# Patient Record
Sex: Male | Born: 1955 | Race: White | Hispanic: No | Marital: Married | Smoking: Former smoker
Health system: Southern US, Community
[De-identification: ages and names within clinical notes are randomized; demographics above are authoritative.]

## PROBLEM LIST (undated history)

## (undated) DIAGNOSIS — C4491 Basal cell carcinoma of skin, unspecified: Secondary | ICD-10-CM

## (undated) DIAGNOSIS — H9319 Tinnitus, unspecified ear: Secondary | ICD-10-CM

## (undated) DIAGNOSIS — D649 Anemia, unspecified: Secondary | ICD-10-CM

## (undated) DIAGNOSIS — K759 Inflammatory liver disease, unspecified: Secondary | ICD-10-CM

## (undated) DIAGNOSIS — E785 Hyperlipidemia, unspecified: Secondary | ICD-10-CM

## (undated) DIAGNOSIS — M1990 Unspecified osteoarthritis, unspecified site: Secondary | ICD-10-CM

## (undated) HISTORY — DX: Hyperlipidemia, unspecified: E78.5

## (undated) HISTORY — PX: COLONOSCOPY: SHX174

## (undated) HISTORY — PX: TONSILLECTOMY: SUR1361

---

## 2015-09-09 ENCOUNTER — Telehealth: Payer: Self-pay | Admitting: Internal Medicine

## 2015-09-09 NOTE — Telephone Encounter (Signed)
I spoke to the patient, and he said he was a former patient of Dr Laney Pastor.  It has been several years since he has been at [facility], so he is considered a new patient.  He has an appointment for a CPE to establish care with Dr Laney Pastor on 11/04/15.  Someone submitted an insurance referral on 02/25/15 for him to see Dr Delman Cheadle using Dr Laney Pastor as the referring physician, even though there is no establishment of care.  The patient has an appointment on 09/11/15 with Dr Delman Cheadle, and needs another referral.  He is not considered a patient here, and I am unsure on how to proceed.  Would it be ok to submit an insurance referral for him since he is planning to re-establish care here?  The previous dx codes are Z80.8 and L57.0

## 2015-09-09 NOTE — Telephone Encounter (Signed)
Patient request a referral to see a dermatologist. Dr. Jari Pigg. Please call patient at [phone]

## 2015-09-09 NOTE — Telephone Encounter (Signed)
So with his insurance I can't refer without having seen him first//or anyone here Also let him know I'm retiring in May in case he wants to establish with someone else

## 2015-09-10 NOTE — Telephone Encounter (Signed)
Spoke to Pt. And tried to explain to him what Dr. Laney Pastor message but he was very frustrated and he said someone did it for him six months ago and he keeps talking to people from our office and does not understand why it can not be done again. I am unsure of who made the referral six months ago but I tried to help explain it better but he was not happy. We were disconnected before we could really resolve this.

## 2015-11-04 ENCOUNTER — Ambulatory Visit (INDEPENDENT_AMBULATORY_CARE_PROVIDER_SITE_OTHER): Payer: 59 | Admitting: Internal Medicine

## 2015-11-04 ENCOUNTER — Encounter: Payer: Self-pay | Admitting: Internal Medicine

## 2015-11-04 VITALS — BP 123/69 | HR 71 | Temp 98.0°F | Resp 16 | Ht 74.0 in | Wt 219.0 lb

## 2015-11-04 DIAGNOSIS — M25551 Pain in right hip: Secondary | ICD-10-CM

## 2015-11-04 DIAGNOSIS — Z0000 Encounter for general adult medical examination without abnormal findings: Secondary | ICD-10-CM | POA: Diagnosis not present

## 2015-11-04 DIAGNOSIS — L989 Disorder of the skin and subcutaneous tissue, unspecified: Secondary | ICD-10-CM

## 2015-11-04 LAB — CBC WITH DIFFERENTIAL/PLATELET
BASOS ABS: 0.1 10*3/uL (ref 0.0–0.1)
Basophils Relative: 1 % (ref 0–1)
EOS PCT: 0 % (ref 0–5)
Eosinophils Absolute: 0 10*3/uL (ref 0.0–0.7)
HEMATOCRIT: 45 % (ref 39.0–52.0)
HEMOGLOBIN: 14.9 g/dL (ref 13.0–17.0)
LYMPHS ABS: 1.3 10*3/uL (ref 0.7–4.0)
LYMPHS PCT: 20 % (ref 12–46)
MCH: 30.3 pg (ref 26.0–34.0)
MCHC: 33.1 g/dL (ref 30.0–36.0)
MCV: 91.5 fL (ref 78.0–100.0)
MPV: 9 fL (ref 8.6–12.4)
Monocytes Absolute: 0.5 10*3/uL (ref 0.1–1.0)
Monocytes Relative: 8 % (ref 3–12)
NEUTROS ABS: 4.8 10*3/uL (ref 1.7–7.7)
Neutrophils Relative %: 71 % (ref 43–77)
Platelets: 296 10*3/uL (ref 150–400)
RBC: 4.92 MIL/uL (ref 4.22–5.81)
RDW: 12.9 % (ref 11.5–15.5)
WBC: 6.7 10*3/uL (ref 4.0–10.5)

## 2015-11-04 LAB — POC HEMOCCULT BLD/STL (OFFICE/1-CARD/DIAGNOSTIC): Fecal Occult Blood, POC: NEGATIVE

## 2015-11-04 LAB — COMPREHENSIVE METABOLIC PANEL
ALK PHOS: 60 U/L (ref 40–115)
ALT: 29 U/L (ref 9–46)
AST: 26 U/L (ref 10–35)
Albumin: 3.9 g/dL (ref 3.6–5.1)
BILIRUBIN TOTAL: 0.5 mg/dL (ref 0.2–1.2)
BUN: 17 mg/dL (ref 7–25)
CALCIUM: 8.7 mg/dL (ref 8.6–10.3)
CO2: 24 mmol/L (ref 20–31)
Chloride: 103 mmol/L (ref 98–110)
Creat: 0.89 mg/dL (ref 0.70–1.33)
Glucose, Bld: 93 mg/dL (ref 65–99)
POTASSIUM: 4.3 mmol/L (ref 3.5–5.3)
Sodium: 137 mmol/L (ref 135–146)
TOTAL PROTEIN: 6.6 g/dL (ref 6.1–8.1)

## 2015-11-04 LAB — LIPID PANEL
CHOL/HDL RATIO: 6.3 ratio — AB (ref <=5.0)
CHOLESTEROL: 202 mg/dL — AB (ref 125–200)
HDL: 32 mg/dL — AB (ref >=40)
LDL Cholesterol: 150 mg/dL — ABNORMAL HIGH (ref <130)
Triglycerides: 102 mg/dL (ref <150)
VLDL: 20 mg/dL (ref <30)

## 2015-11-05 LAB — PSA: PSA: 1.29 ng/mL (ref <=4.00)

## 2015-11-06 ENCOUNTER — Encounter: Payer: Self-pay | Admitting: Internal Medicine

## 2015-11-06 NOTE — Progress Notes (Signed)
Subjective:    Patient ID: Juan Powers, male    DOB: 1956/01/22, 59 y.o.   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 There is some loss of range of motion.  Past medical history of frequent skin surgeries to prevent actinic keratoses from progressing to basal cell carcinomas-dr Delman Cheadle On no medication Health maintenance issues up-to-date Married with 2 daughters(Ali and Gila) on their own Both in [place] 14 point review of systems performed negative except for very mild tinnitus which has been present for years and does not interfere with his singing    Objective:   Physical Exam  Constitutional: He is oriented to person, place, and time. He appears well-developed and well-nourished. No distress.  HENT:  Head: Normocephalic and atraumatic.  Right Ear: Hearing, tympanic membrane, external ear and ear canal normal.  Left Ear: Hearing, tympanic membrane, external ear and ear canal normal.  Nose: Nose normal.  Mouth/Throat: Uvula is midline, oropharynx is clear and moist and mucous membranes are normal.  Eyes: Conjunctivae, EOM and lids are normal. Pupils are equal, round, and reactive to light. Right eye exhibits no discharge. Left eye exhibits no discharge. No scleral icterus.  Neck: Trachea normal and normal range of motion. Neck supple. Carotid bruit is not present.  Cardiovascular: Normal rate, regular rhythm, normal heart sounds, intact distal pulses and normal pulses.   No murmur heard. Pulmonary/Chest: Effort normal and breath sounds normal. No respiratory distress. He has no wheezes. He has no rhonchi. He has no rales.  Abdominal: Soft. Normal appearance and bowel sounds are normal. He exhibits no abdominal bruit. There is no tenderness.    Genitourinary:  Prostate soft and symmetrical without nodules No rectal masses Hemoccult-negative  Musculoskeletal: Normal range of motion. He exhibits no edema or tenderness.  Right hip has decreased in external rotation compared to the left with pain on crossing his leg but with fairly good range of motion otherwise/ there is no tenderness over the greater trochanter/ the SI joint is nontender  Lymphadenopathy:       Head (right side): No submental, no submandibular, no tonsillar, no preauricular, no posterior auricular and no occipital adenopathy present.       Head (left side): No submental, no submandibular, no tonsillar, no preauricular, no posterior auricular and no occipital adenopathy present.    He has no cervical adenopathy.  Neurological: He is alert and oriented to person, place, and time. He has normal strength and normal reflexes. No cranial nerve deficit or sensory deficit. Coordination and gait normal.  Skin: Skin is warm, dry and intact. No lesion and no rash noted.  Psychiatric: He has a normal mood and affect. His speech is normal and behavior is normal. Judgment and thought content normal.  Nursing note and vitals reviewed. BP 123/69 mmHg  Pulse 71  Temp(Src) 98 F (36.7 C)  Resp 16  Ht '6\' 2"'  (1.88 m)  Wt 219 lb (99.338 kg)  BMI 28.11 kg/m2      Assessment & Plan:  Annual physical exam - Plan: CBC with Differential/Platelet, POC Hemoccult Bld/Stl (1-Cd Office Dx), Comprehensive metabolic panel, Lipid panel, PSA  Hip pain secondary to overuse injury We will refer toDr O'Halloran for physical therapy-imaging only if he doesn't respond  Recurrent skin-sun related  problems Refer back  to Dr. Delman Cheadle   Addendum labs 11-06-15 Results for orders placed or performed in visit on 11/04/15  CBC with Differential/Platelet  Result Value Ref Range   WBC 6.7 4.0 - 10.5 K/uL   RBC 4.92 4.22 - 5.81 MIL/uL   Hemoglobin 14.9 13.0 - 17.0 g/dL   HCT 45.0 39.0 - 52.0 %   MCV  91.5 78.0 - 100.0 fL   MCH 30.3 26.0 - 34.0 pg   MCHC 33.1 30.0 - 36.0 g/dL   RDW 12.9 11.5 - 15.5 %   Platelets 296 150 - 400 K/uL   MPV 9.0 8.6 - 12.4 fL   Neutrophils Relative % 71 43 - 77 %   Neutro Abs 4.8 1.7 - 7.7 K/uL   Lymphocytes Relative 20 12 - 46 %   Lymphs Abs 1.3 0.7 - 4.0 K/uL   Monocytes Relative 8 3 - 12 %   Monocytes Absolute 0.5 0.1 - 1.0 K/uL   Eosinophils Relative 0 0 - 5 %   Eosinophils Absolute 0.0 0.0 - 0.7 K/uL   Basophils Relative 1 0 - 1 %   Basophils Absolute 0.1 0.0 - 0.1 K/uL   Smear Review Criteria for review not met   Comprehensive metabolic panel  Result Value Ref Range   Sodium 137 135 - 146 mmol/L   Potassium 4.3 3.5 - 5.3 mmol/L   Chloride 103 98 - 110 mmol/L   CO2 24 20 - 31 mmol/L   Glucose, Bld 93 65 - 99 mg/dL   BUN 17 7 - 25 mg/dL   Creat 0.89 0.70 - 1.33 mg/dL   Total Bilirubin 0.5 0.2 - 1.2 mg/dL   Alkaline Phosphatase 60 40 - 115 U/L   AST 26 10 - 35 U/L   ALT 29 9 - 46 U/L   Total Protein 6.6 6.1 - 8.1 g/dL   Albumin 3.9 3.6 - 5.1 g/dL   Calcium 8.7 8.6 - 10.3 mg/dL  Lipid panel  Result Value Ref Range   Cholesterol 202 (H) 125 - 200 mg/dL   Triglycerides 102 <150 mg/dL   HDL 32 (L) >=40 mg/dL   Total CHOL/HDL Ratio 6.3 (H) <=5.0 Ratio   VLDL 20 <30 mg/dL   LDL Cholesterol 150 (H) <130 mg/dL  PSA  Result Value Ref Range   PSA 1.29 <=4.00 ng/mL  POC Hemoccult Bld/Stl (1-Cd Office Dx)  Result Value Ref Range   Card #1 Date 11/04/2015    Fecal Occult Blood, POC Negative Negative   We will approach his elevated LDL with diet and exercise

## 2016-08-26 ENCOUNTER — Encounter (HOSPITAL_COMMUNITY): Payer: Self-pay | Admitting: *Deleted

## 2016-08-26 ENCOUNTER — Ambulatory Visit (HOSPITAL_COMMUNITY)
Admission: EM | Admit: 2016-08-26 | Discharge: 2016-08-26 | Disposition: A | Discharge status: Home / Self Care | Payer: BLUE CROSS/BLUE SHIELD | Attending: Internal Medicine | Admitting: Internal Medicine

## 2016-08-26 DIAGNOSIS — J069 Acute upper respiratory infection, unspecified: Secondary | ICD-10-CM

## 2016-08-26 DIAGNOSIS — R05 Cough: Secondary | ICD-10-CM | POA: Diagnosis not present

## 2016-08-26 DIAGNOSIS — R059 Cough, unspecified: Secondary | ICD-10-CM

## 2016-08-26 MED ORDER — AMOXICILLIN-POT CLAVULANATE 875-125 MG PO TABS: 1.0000 | ORAL_TABLET | Freq: Two times a day (BID) | ORAL | 0 refills | Status: DC

## 2016-08-26 MED ORDER — HYDROCODONE-HOMATROPINE 5-1.5 MG/5ML PO SYRP: 5.0000 mL | ORAL_SOLUTION | Freq: Four times a day (QID) | ORAL | 0 refills | Status: DC | PRN

## 2016-08-26 NOTE — Discharge Instructions (Signed)
You most likely have a bacterial upper respiratory infection or sinusitis. Treat with antibiotic. May use Motrin 600mg  every 8 hours combined with Mucinex and Zyrtec to make a great "cold pill". May use the cough syrup every 6 hours as needed, but will  make you sleepy. Feel better stay hydrated.

## 2016-08-26 NOTE — ED Triage Notes (Signed)
Pt  Reports       Symptoms     Of  Cough    / congested    Sinus         With  Drainage      And fever    With  Symptoms  For  A  Few  Days

## 2016-08-26 NOTE — ED Provider Notes (Signed)
CSN: [id number]     Arrival date & time 08/26/16  1715 History   First MD Initiated Contact with Patient 08/26/16 1828     Chief Complaint  Patient presents with  . URI   (Consider location/radiation/quality/duration/timing/severity/associated sxs/prior Treatment) Juan Powers presents with a 1 week history of nasal congestion, sore throat, now dry cough, malaise and fever.       No past medical history on file. Past Surgical History:  Procedure Laterality Date  . TONSILLECTOMY     Family History  Problem Relation Age of Onset  . Cancer Father   . Heart disease Father   . Cancer Sister    Social History  Substance Use Topics  . Smoking status: Former Research scientist (life sciences)  . Smokeless tobacco: Not on file  . Alcohol use 0.0 oz/week    Review of Systems  Constitutional: Positive for fatigue and fever.  HENT: Positive for congestion and sinus pressure.   Respiratory: Positive for cough. Negative for shortness of breath and wheezing.   Musculoskeletal: Negative for myalgias.  Allergic/Immunologic: Negative for environmental allergies.    Allergies  Other  Home Medications   Prior to Admission medications   Medication Sig Start Date End Date Taking? Authorizing Provider  amoxicillin-clavulanate (AUGMENTIN) 875-125 MG tablet Take 1 tablet by mouth 2 (two) times daily. 08/26/16   Bjorn Pippin, PA-C  HYDROcodone-homatropine (HYCODAN) 5-1.5 MG/5ML syrup Take 5 mLs by mouth every 6 (six) hours as needed for cough. 08/26/16   Bjorn Pippin, PA-C   Meds Ordered and Administered this Visit  Medications - No data to display  There were no vitals taken for this visit. No data found.   Physical Exam  Constitutional: He is oriented to person, place, and time. He appears well-developed and well-nourished. No distress.  HENT:  Head: Normocephalic and atraumatic.  Right Ear: External ear normal.  Left Ear: External ear normal.  Erythematous oropharynx, no exudate, erythematous nasal  turbinates with yellow exudate  Neck: Normal range of motion. Neck supple.  Cardiovascular: Normal rate and regular rhythm.   Pulmonary/Chest: Effort normal and breath sounds normal. He has no wheezes.  Lymphadenopathy:    He has no cervical adenopathy.  Neurological: He is alert and oriented to person, place, and time.  Skin: Skin is warm and dry. He is not diaphoretic.  Nursing note and vitals reviewed.   Urgent Care Course   Clinical Course    Procedures (including critical care time)  Labs Review Labs Reviewed - No data to display  Imaging Review No results found.   Visual Acuity Review  Right Eye Distance:   Left Eye Distance:   Bilateral Distance:    Right Eye Near:   Left Eye Near:    Bilateral Near:         MDM   1. URI (upper respiratory infection)   2. Cough    Presents with fevers, cough and congestion x 7 days. Given duration, fevers and malaise will cover with antibiotics. Treat symptomatically with Hycodan, Mucinex and Zyrtec. Motrin as directed for fevers.     Bjorn Pippin, PA-C 08/26/16 [phone]

## 2018-05-31 ENCOUNTER — Ambulatory Visit (INDEPENDENT_AMBULATORY_CARE_PROVIDER_SITE_OTHER): Payer: Self-pay | Admitting: Orthopaedic Surgery

## 2018-06-11 ENCOUNTER — Ambulatory Visit (INDEPENDENT_AMBULATORY_CARE_PROVIDER_SITE_OTHER): Payer: Managed Care, Other (non HMO)

## 2018-06-11 ENCOUNTER — Encounter (INDEPENDENT_AMBULATORY_CARE_PROVIDER_SITE_OTHER): Payer: Self-pay | Admitting: Orthopaedic Surgery

## 2018-06-11 ENCOUNTER — Ambulatory Visit (INDEPENDENT_AMBULATORY_CARE_PROVIDER_SITE_OTHER): Payer: Managed Care, Other (non HMO) | Admitting: Orthopaedic Surgery

## 2018-06-11 DIAGNOSIS — G8929 Other chronic pain: Secondary | ICD-10-CM | POA: Diagnosis not present

## 2018-06-11 DIAGNOSIS — M1611 Unilateral primary osteoarthritis, right hip: Secondary | ICD-10-CM

## 2018-06-11 DIAGNOSIS — M545 Low back pain, unspecified: Secondary | ICD-10-CM

## 2018-06-11 DIAGNOSIS — M25551 Pain in right hip: Secondary | ICD-10-CM

## 2018-06-11 NOTE — Progress Notes (Signed)
Office Visit Note   Patient: Juan Powers           Date of Birth: [birth year]           MRN: [id number] Visit Date: 06/11/2018              Requested by: No referring provider defined for this encounter. PCP: Patient, No Pcp Per   Assessment & Plan: Visit Diagnoses:  1. Chronic right-sided low back pain without sciatica   2. Pain in right hip   3. Unilateral primary osteoarthritis, right hip     Plan: His right hip is really was bothering him the most.  At this point his pain is become daily and is almost detrimentally affect is active daily living, quality of life, his mobility.  Given his x-ray findings and severity of arthritis of his hip we are recommending hip replacement surgery.  At this point a steroid injection would only temporize things minimally given the severity of arthritis.  We went over his x-rays in detail and I talked about hip replacement surgery.  I gave him a handout about this as well.  We talked about his intraoperative and postoperative course as well as the risk and benefits of the surgery.  This is something may consider in the near future.  I gave him our surgery schedulers card if he has questions about insurance issues as relates to this type of surgery.  I did give him a printout of his x-rays as well and told him happy to talk to him about this further but I do feel that a hip replacement surgery to help him significantly improve his mobility as well as quality of life.  All question concerns were answered and addressed.  Follow-Up Instructions: Return if symptoms worsen or fail to improve.   Orders:  Orders Placed This Encounter  Procedures  . XR Lumbar Spine 2-3 Views  . XR HIP UNILAT W OR W/O PELVIS 1V RIGHT   No orders of the defined types were placed in this encounter.     Procedures: No procedures performed   Clinical Data: No additional findings.   Subjective: Chief Complaint  Patient presents with  . Right Hip - Pain  Patient  comes in today with chief complaint of right hip pain as well as low back pain the right hip is been worsening for about 2 to 3 years now it hurts in the groin.  Is very stiff hip and hurts with rotation and aerobic activities with mobility.  It hurts on a daily basis.  He started to walk with a limp because of this.  He is also had chronic low back pain for a long period of time with a history of renal injury back in the 100s.  He did seek chiropractic care for the back before.  He says his right hip is the biggest issue for him.  He denies any left hip pain at all.  He denies any numbness and tingling going down his feet legs or any weakness.  When I pressed him on this he says he does get occasional numbness in his feet.  Is not diabetic and not a smoker.  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  He denies any specific injury to the hip but is been slowly getting worse with time.  HPI  Review of Systems He currently denies any headache, chest pain, shortness of breath, fever, chills, nausea,  vomiting.  Objective: Vital Signs: There were no vitals taken for this visit.  Physical Exam He is alert and oriented x3 and in no acute distress Ortho Exam Examination of his left hip is normal examination of his right hip shows significant stiffness with decreased internal extra rotation as well as pain on internal and external rotation.  He has good flexion-extension of lumbar spine with pain more on extension of the lumbar spine with some limitations in extension as well.  He has great strength in his bilateral lower extremity today with normal reflexes and normal sensation. Specialty Comments:  No specialty comments available.  Imaging: Xr Hip Unilat W Or W/o Pelvis 1v Right  Result Date: 06/11/2018 An AP pelvis and lateral of the right hip show severe end-stage arthritis of the right hip.  There is complete loss of superior lateral joint space.  There are large  para-articular osteophytes coming off the acetabulum and the femoral head with femoral head flattening.  There are sclerotic changes in the femoral head and acetabulum as well.  The left hip on the AP view does show moderate to severe osteoarthritis as well.  Xr Lumbar Spine 2-3 Views  Result Date: 06/11/2018 2 views of the lumbar spine shows severe degenerative changes from L3-L4 and L5-S1.  This involves the posterior facets and the disc spaces themselves as well as the foramina.    PMFS History: There are no active problems to display for this patient.  History reviewed. No pertinent past medical history.  Family History  Problem Relation Age of Onset  . Cancer Father   . Heart disease Father   . Cancer Sister     Past Surgical History:  Procedure Laterality Date  . TONSILLECTOMY     Social History   Occupational History  . Not on file  Tobacco Use  . Smoking status: Former Smoker  Substance and Sexual Activity  . Alcohol use: Yes    Alcohol/week: 0.0 oz  . Drug use: No  . Sexual activity: Not on file

## 2018-06-11 NOTE — Progress Notes (Deleted)
                                                                                                                                                                                                    Office Visit Note   Patient: Juan Powers           Date of Birth: 1956-08-30           MRN: [id number] Visit Date: 06/11/2018              Requested by: No referring provider defined for this encounter. PCP: Patient, No Pcp Per   Assessment & Plan: Visit Diagnoses:  1. Chronic right-sided low back pain without sciatica   2. Pain in right hip     Plan: ***  Follow-Up Instructions: No follow-ups on file.   Orders:  Orders Placed This Encounter  Procedures  . XR Lumbar Spine 2-3 Views  . XR HIP UNILAT W OR W/O PELVIS 1V RIGHT   No orders of the defined types were placed in this encounter.     Procedures: No procedures performed   Clinical Data: No additional findings.   Subjective: Chief Complaint  Patient presents with  . Right Hip - Pain    HPI  Review of Systems   Objective: Vital Signs: There were no vitals taken for this visit.  Physical Exam  Ortho Exam  Specialty Comments:  No specialty comments available.  Imaging: No results found.   PMFS History: There are no active problems to display for this patient.  History reviewed. No pertinent past medical history.  Family History  Problem Relation Age of Onset  . Cancer Father   . Heart disease Father   . Cancer Sister     Past Surgical History:  Procedure Laterality Date  . TONSILLECTOMY     Social History   Occupational History  . Not on file  Tobacco Use  . Smoking status: Former Smoker  Substance and Sexual Activity  . Alcohol use: Yes    Alcohol/week: 0.0 oz  . Drug use: No  . Sexual activity:  Not on file

## 2018-10-03 ENCOUNTER — Telehealth (INDEPENDENT_AMBULATORY_CARE_PROVIDER_SITE_OTHER): Payer: Self-pay

## 2018-10-03 NOTE — Telephone Encounter (Signed)
Wife, Juan Powers called and discussed scheduling [facility] for 11-23-18.  She then called back and left voice mail that they will postpone until Summer 2020.

## 2018-11-12 ENCOUNTER — Other Ambulatory Visit (INDEPENDENT_AMBULATORY_CARE_PROVIDER_SITE_OTHER): Payer: Self-pay | Admitting: Physician Assistant

## 2018-11-12 ENCOUNTER — Other Ambulatory Visit (INDEPENDENT_AMBULATORY_CARE_PROVIDER_SITE_OTHER): Payer: Self-pay

## 2018-11-14 NOTE — Patient Instructions (Addendum)
Juan Powers  11/14/2018   Your procedure is scheduled on: 11-23-18   Report to [facility] Main  Entrance    Report to admitting at 6:00AM    Call this number if you have problems the morning of surgery [phone]     Remember: Do not eat food or drink liquids :After Midnight. BRUSH YOUR TEETH MORNING OF SURGERY AND RINSE YOUR MOUTH OUT, NO CHEWING GUM CANDY OR MINTS.     Take these medicines the morning of surgery with A SIP OF WATER: zyrtec                                You may not have any metal on your body including hair pins and              piercings  Do not wear jewelry, make-up, lotions, powders or perfumes, deodorant                       Men may shave face and neck.   Do not bring valuables to the hospital. [place].  Contacts, dentures or bridgework may not be worn into surgery.  Leave suitcase in the car. After surgery it may be brought to your room.                 Please read over the following fact sheets you were given: _____________________________________________________________________    [facility] - Preparing for Surgery Before surgery, you can play an important role.  Because skin is not sterile, your skin needs to be as free of germs as possible.  You can reduce the number of germs on your skin by washing with CHG (chlorahexidine gluconate) soap before surgery.  CHG is an antiseptic cleaner which kills germs and bonds with the skin to continue killing germs even after washing. Please DO NOT use if you have an allergy to CHG or antibacterial soaps.  If your skin becomes reddened/irritated stop using the CHG and inform your nurse when you arrive at Short Stay. Do not shave (including legs and underarms) for at least 48 hours prior to the first CHG shower.  You may shave your face/neck. Please follow these instructions carefully:  1.  Shower with CHG Soap the night before  surgery and the  morning of Surgery.  2.  If you choose to wash your hair, wash your hair first as usual with your  normal  shampoo.  3.  After you shampoo, rinse your hair and body thoroughly to remove the  shampoo.                           4.  Use CHG as you would any other liquid soap.  You can apply chg directly  to the skin and wash                       Gently with a scrungie or clean washcloth.  5.  Apply the CHG Soap to your body ONLY FROM THE NECK DOWN.   Do not use on face/ open  Wound or open sores. Avoid contact with eyes, ears mouth and genitals (private parts).                       Wash face,  Genitals (private parts) with your normal soap.             6.  Wash thoroughly, paying special attention to the area where your surgery  will be performed.  7.  Thoroughly rinse your body with warm water from the neck down.  8.  DO NOT shower/wash with your normal soap after using and rinsing off  the CHG Soap.                9.  Pat yourself dry with a clean towel.            10.  Wear clean pajamas.            11.  Place clean sheets on your bed the night of your first shower and do not  sleep with pets. Day of Surgery : Do not apply any lotions/deodorants the morning of surgery.  Please wear clean clothes to the hospital/surgery center.  FAILURE TO FOLLOW THESE INSTRUCTIONS MAY RESULT IN THE CANCELLATION OF YOUR SURGERY PATIENT SIGNATURE_________________________________  NURSE SIGNATURE__________________________________  ________________________________________________________________________              Juan Powers  An incentive spirometer is a tool that can help keep your lungs clear and active. This tool measures how well you are filling your lungs with each breath. Taking long deep breaths may help reverse or decrease the chance of developing breathing (pulmonary) problems (especially infection) following:  A long period of time when you  are unable to move or be active. BEFORE THE PROCEDURE   If the spirometer includes an indicator to show your best effort, your nurse or respiratory therapist will set it to a desired goal.  If possible, sit up straight or lean slightly forward. Try not to slouch.  Hold the incentive spirometer in an upright position. INSTRUCTIONS FOR USE  1. Sit on the edge of your bed if possible, or sit up as far as you can in bed or on a chair. 2. Hold the incentive spirometer in an upright position. 3. Breathe out normally. 4. Place the mouthpiece in your mouth and seal your lips tightly around it. 5. Breathe in slowly and as deeply as possible, raising the piston or the ball toward the top of the column. 6. Hold your breath for 3-5 seconds or for as long as possible. Allow the piston or ball to fall to the bottom of the column. 7. Remove the mouthpiece from your mouth and breathe out normally. 8. Rest for a few seconds and repeat Steps 1 through 7 at least 10 times every 1-2 hours when you are awake. Take your time and take a few normal breaths between deep breaths. 9. The spirometer may include an indicator to show your best effort. Use the indicator as a goal to work toward during each repetition. 10. After each set of 10 deep breaths, practice coughing to be sure your lungs are clear. If you have an incision (the cut made at the time of surgery), support your incision when coughing by placing a pillow or rolled up towels firmly against it. Once you are able to get out of bed, walk around indoors and cough well. You may stop using the incentive spirometer when instructed by your caregiver.  RISKS  AND COMPLICATIONS  Take your time so you do not get dizzy or light-headed.  If you are in pain, you may need to take or ask for pain medication before doing incentive spirometry. It is harder to take a deep breath if you are having pain. AFTER USE  Rest and breathe slowly and easily.  It can be helpful to  keep track of a log of your progress. Your caregiver can provide you with a simple table to help with this. If you are using the spirometer at home, follow these instructions: [place] IF:   You are having difficultly using the spirometer.  You have trouble using the spirometer as often as instructed.  Your pain medication is not giving enough relief while using the spirometer.  You develop fever of 100.5 F (38.1 C) or higher. SEEK IMMEDIATE MEDICAL CARE IF:   You cough up bloody sputum that had not been present before.  You develop fever of 102 F (38.9 C) or greater.  You develop worsening pain at or near the incision site. MAKE SURE YOU:   Understand these instructions.  Will watch your condition.  Will get help right away if you are not doing well or get worse. Document Released: 04/03/2007 Document Revised: 02/13/2012 Document Reviewed: 06/04/2007 [facility] Patient Information 2014 [place], [place].   ________________________________________________________________________

## 2018-11-19 ENCOUNTER — Other Ambulatory Visit: Payer: Self-pay

## 2018-11-19 ENCOUNTER — Encounter (HOSPITAL_COMMUNITY)
Admission: RE | Admit: 2018-11-19 | Discharge: 2018-11-19 | Disposition: A | Discharge status: Home / Self Care | Payer: Managed Care, Other (non HMO) | Source: Ambulatory Visit | Attending: Orthopaedic Surgery | Admitting: Orthopaedic Surgery

## 2018-11-19 ENCOUNTER — Encounter (HOSPITAL_COMMUNITY): Payer: Self-pay

## 2018-11-19 DIAGNOSIS — M1611 Unilateral primary osteoarthritis, right hip: Secondary | ICD-10-CM | POA: Diagnosis not present

## 2018-11-19 DIAGNOSIS — Z01818 Encounter for other preprocedural examination: Secondary | ICD-10-CM | POA: Diagnosis not present

## 2018-11-19 HISTORY — DX: Inflammatory liver disease, unspecified: K75.9

## 2018-11-19 HISTORY — DX: Unspecified osteoarthritis, unspecified site: M19.90

## 2018-11-19 HISTORY — DX: Tinnitus, unspecified ear: H93.19

## 2018-11-19 HISTORY — DX: Basal cell carcinoma of skin, unspecified: C44.91

## 2018-11-19 HISTORY — DX: Anemia, unspecified: D64.9

## 2018-11-19 LAB — CBC
HCT: 47.8 % (ref 39.0–52.0)
Hemoglobin: 15.5 g/dL (ref 13.0–17.0)
MCH: 30 pg (ref 26.0–34.0)
MCHC: 32.4 g/dL (ref 30.0–36.0)
MCV: 92.5 fL (ref 80.0–100.0)
Platelets: 302 10*3/uL (ref 150–400)
RBC: 5.17 MIL/uL (ref 4.22–5.81)
RDW: 11.9 % (ref 11.5–15.5)
WBC: 6 10*3/uL (ref 4.0–10.5)
nRBC: 0 % (ref 0.0–0.2)

## 2018-11-19 LAB — SURGICAL PCR SCREEN
MRSA, PCR: NEGATIVE
STAPHYLOCOCCUS AUREUS: NEGATIVE

## 2018-11-23 ENCOUNTER — Inpatient Hospital Stay (HOSPITAL_COMMUNITY): Payer: Managed Care, Other (non HMO) | Admitting: Anesthesiology

## 2018-11-23 ENCOUNTER — Inpatient Hospital Stay (HOSPITAL_COMMUNITY): Payer: Managed Care, Other (non HMO)

## 2018-11-23 ENCOUNTER — Inpatient Hospital Stay (HOSPITAL_COMMUNITY)
Admission: RE | Admit: 2018-11-23 | Discharge: 2018-11-24 | DRG: 470 | Disposition: A | Discharge status: Home / Self Care | Payer: Managed Care, Other (non HMO) | Attending: Orthopaedic Surgery | Admitting: Orthopaedic Surgery

## 2018-11-23 ENCOUNTER — Encounter (HOSPITAL_COMMUNITY)
Admission: RE | Disposition: A | Discharge status: Home / Self Care | Payer: Self-pay | Source: Home / Self Care | Attending: Orthopaedic Surgery

## 2018-11-23 ENCOUNTER — Other Ambulatory Visit: Payer: Self-pay

## 2018-11-23 ENCOUNTER — Encounter (HOSPITAL_COMMUNITY): Payer: Self-pay | Admitting: Anesthesiology

## 2018-11-23 DIAGNOSIS — M25559 Pain in unspecified hip: Secondary | ICD-10-CM

## 2018-11-23 DIAGNOSIS — Z87891 Personal history of nicotine dependence: Secondary | ICD-10-CM | POA: Diagnosis not present

## 2018-11-23 DIAGNOSIS — Z8249 Family history of ischemic heart disease and other diseases of the circulatory system: Secondary | ICD-10-CM | POA: Diagnosis not present

## 2018-11-23 DIAGNOSIS — M160 Bilateral primary osteoarthritis of hip: Principal | ICD-10-CM | POA: Diagnosis present

## 2018-11-23 DIAGNOSIS — Z85828 Personal history of other malignant neoplasm of skin: Secondary | ICD-10-CM

## 2018-11-23 DIAGNOSIS — Z8619 Personal history of other infectious and parasitic diseases: Secondary | ICD-10-CM | POA: Diagnosis not present

## 2018-11-23 DIAGNOSIS — Z96641 Presence of right artificial hip joint: Secondary | ICD-10-CM

## 2018-11-23 DIAGNOSIS — Z79899 Other long term (current) drug therapy: Secondary | ICD-10-CM

## 2018-11-23 DIAGNOSIS — M1611 Unilateral primary osteoarthritis, right hip: Secondary | ICD-10-CM | POA: Diagnosis not present

## 2018-11-23 DIAGNOSIS — Z888 Allergy status to other drugs, medicaments and biological substances status: Secondary | ICD-10-CM | POA: Diagnosis not present

## 2018-11-23 HISTORY — PX: TOTAL HIP ARTHROPLASTY: SHX124

## 2018-11-23 SURGERY — ARTHROPLASTY, HIP, TOTAL, ANTERIOR APPROACH
Anesthesia: Spinal | Site: Hip | Laterality: Right

## 2018-11-23 MED ORDER — TAMSULOSIN HCL 0.4 MG PO CAPS
0.4000 mg | ORAL_CAPSULE | Freq: Every day | ORAL | Status: DC
  Administered 2018-11-23: 0.4 mg via ORAL
  Filled 2018-11-23: qty 1

## 2018-11-23 MED ORDER — TRANEXAMIC ACID-NACL 1000-0.7 MG/100ML-% IV SOLN
1000.0000 mg | INTRAVENOUS | Status: AC
  Administered 2018-11-23: 1000 mg via INTRAVENOUS
  Filled 2018-11-23: qty 100

## 2018-11-23 MED ORDER — METHOCARBAMOL 500 MG IVPB - SIMPLE MED
500.0000 mg | Freq: Four times a day (QID) | INTRAVENOUS | Status: DC | PRN
  Administered 2018-11-23: 500 mg via INTRAVENOUS
  Filled 2018-11-23: qty 50

## 2018-11-23 MED ORDER — MEPERIDINE HCL 50 MG/ML IJ SOLN: 6.2500 mg | INTRAMUSCULAR | Status: DC | PRN

## 2018-11-23 MED ORDER — ACETAMINOPHEN 325 MG PO TABS
325.0000 mg | ORAL_TABLET | Freq: Four times a day (QID) | ORAL | Status: DC | PRN
  Administered 2018-11-23: 650 mg via ORAL
  Filled 2018-11-23: qty 2

## 2018-11-23 MED ORDER — LACTATED RINGERS IV SOLN
INTRAVENOUS | Status: DC
  Administered 2018-11-23 (×2): via INTRAVENOUS

## 2018-11-23 MED ORDER — ONDANSETRON HCL 4 MG/2ML IJ SOLN
INTRAMUSCULAR | Status: DC | PRN
  Administered 2018-11-23: 4 mg via INTRAVENOUS

## 2018-11-23 MED ORDER — DEXAMETHASONE SODIUM PHOSPHATE 10 MG/ML IJ SOLN
INTRAMUSCULAR | Status: AC
  Filled 2018-11-23: qty 1

## 2018-11-23 MED ORDER — SODIUM CHLORIDE 0.9 % IR SOLN
Status: DC | PRN
  Administered 2018-11-23: 1000 mL

## 2018-11-23 MED ORDER — 0.9 % SODIUM CHLORIDE (POUR BTL) OPTIME
TOPICAL | Status: DC | PRN
  Administered 2018-11-23: 1000 mL

## 2018-11-23 MED ORDER — MIDAZOLAM HCL 5 MG/5ML IJ SOLN
INTRAMUSCULAR | Status: DC | PRN
  Administered 2018-11-23: 2 mg via INTRAVENOUS

## 2018-11-23 MED ORDER — OXYCODONE HCL 5 MG PO TABS
10.0000 mg | ORAL_TABLET | ORAL | Status: DC | PRN
  Administered 2018-11-23: 15 mg via ORAL
  Filled 2018-11-23: qty 2
  Filled 2018-11-23: qty 3

## 2018-11-23 MED ORDER — METHOCARBAMOL 500 MG IVPB - SIMPLE MED
INTRAVENOUS | Status: AC
  Filled 2018-11-23: qty 50

## 2018-11-23 MED ORDER — DOCUSATE SODIUM 100 MG PO CAPS
100.0000 mg | ORAL_CAPSULE | Freq: Two times a day (BID) | ORAL | Status: DC
  Administered 2018-11-23 – 2018-11-24 (×2): 100 mg via ORAL
  Filled 2018-11-23 (×2): qty 1

## 2018-11-23 MED ORDER — KETOROLAC TROMETHAMINE 15 MG/ML IJ SOLN
15.0000 mg | Freq: Four times a day (QID) | INTRAMUSCULAR | Status: DC
  Administered 2018-11-23 – 2018-11-24 (×4): 15 mg via INTRAVENOUS
  Filled 2018-11-23 (×4): qty 1

## 2018-11-23 MED ORDER — FENTANYL CITRATE (PF) 100 MCG/2ML IJ SOLN: 25.0000 ug | INTRAMUSCULAR | Status: DC | PRN

## 2018-11-23 MED ORDER — CHLORHEXIDINE GLUCONATE 4 % EX LIQD: 60.0000 mL | Freq: Once | CUTANEOUS | Status: DC

## 2018-11-23 MED ORDER — MENTHOL 3 MG MT LOZG: 1.0000 | LOZENGE | OROMUCOSAL | Status: DC | PRN

## 2018-11-23 MED ORDER — KETOROLAC TROMETHAMINE 15 MG/ML IJ SOLN: 15.0000 mg | Freq: Once | INTRAMUSCULAR | Status: DC

## 2018-11-23 MED ORDER — SODIUM CHLORIDE 0.9 % IV SOLN
INTRAVENOUS | Status: DC
  Administered 2018-11-23: 12:00:00 via INTRAVENOUS

## 2018-11-23 MED ORDER — EPHEDRINE SULFATE-NACL 50-0.9 MG/10ML-% IV SOSY
PREFILLED_SYRINGE | INTRAVENOUS | Status: DC | PRN
  Administered 2018-11-23 (×2): 5 mg via INTRAVENOUS
  Administered 2018-11-23: 10 mg via INTRAVENOUS

## 2018-11-23 MED ORDER — ONDANSETRON HCL 4 MG/2ML IJ SOLN: 4.0000 mg | Freq: Four times a day (QID) | INTRAMUSCULAR | Status: DC | PRN

## 2018-11-23 MED ORDER — PROPOFOL 10 MG/ML IV BOLUS
INTRAVENOUS | Status: DC | PRN
  Administered 2018-11-23 (×2): 20 mg via INTRAVENOUS

## 2018-11-23 MED ORDER — PHENOL 1.4 % MT LIQD: 1.0000 | OROMUCOSAL | Status: DC | PRN

## 2018-11-23 MED ORDER — ZOLPIDEM TARTRATE 5 MG PO TABS: 5.0000 mg | ORAL_TABLET | Freq: Every evening | ORAL | Status: DC | PRN

## 2018-11-23 MED ORDER — EPHEDRINE 5 MG/ML INJ
INTRAVENOUS | Status: AC
  Filled 2018-11-23: qty 10

## 2018-11-23 MED ORDER — FENTANYL CITRATE (PF) 100 MCG/2ML IJ SOLN
INTRAMUSCULAR | Status: AC
  Filled 2018-11-23: qty 2

## 2018-11-23 MED ORDER — METOCLOPRAMIDE HCL 5 MG PO TABS: 5.0000 mg | ORAL_TABLET | Freq: Three times a day (TID) | ORAL | Status: DC | PRN

## 2018-11-23 MED ORDER — DEXAMETHASONE SODIUM PHOSPHATE 10 MG/ML IJ SOLN
INTRAMUSCULAR | Status: DC | PRN
  Administered 2018-11-23: 10 mg via INTRAVENOUS

## 2018-11-23 MED ORDER — ASPIRIN 81 MG PO CHEW
81.0000 mg | CHEWABLE_TABLET | Freq: Two times a day (BID) | ORAL | Status: DC
  Administered 2018-11-23 – 2018-11-24 (×2): 81 mg via ORAL
  Filled 2018-11-23 (×2): qty 1

## 2018-11-23 MED ORDER — DIPHENHYDRAMINE HCL 12.5 MG/5ML PO ELIX: 12.5000 mg | ORAL_SOLUTION | ORAL | Status: DC | PRN

## 2018-11-23 MED ORDER — HYDROMORPHONE HCL 1 MG/ML IJ SOLN: 0.5000 mg | INTRAMUSCULAR | Status: DC | PRN

## 2018-11-23 MED ORDER — ONDANSETRON HCL 4 MG PO TABS: 4.0000 mg | ORAL_TABLET | Freq: Four times a day (QID) | ORAL | Status: DC | PRN

## 2018-11-23 MED ORDER — METOCLOPRAMIDE HCL 5 MG/ML IJ SOLN: 5.0000 mg | Freq: Three times a day (TID) | INTRAMUSCULAR | Status: DC | PRN

## 2018-11-23 MED ORDER — CEFAZOLIN SODIUM-DEXTROSE 1-4 GM/50ML-% IV SOLN
1.0000 g | Freq: Four times a day (QID) | INTRAVENOUS | Status: AC
  Administered 2018-11-23 (×2): 1 g via INTRAVENOUS
  Filled 2018-11-23 (×2): qty 50

## 2018-11-23 MED ORDER — FENTANYL CITRATE (PF) 100 MCG/2ML IJ SOLN
INTRAMUSCULAR | Status: DC | PRN
  Administered 2018-11-23: 100 ug via INTRAVENOUS

## 2018-11-23 MED ORDER — PHENYLEPHRINE 40 MCG/ML (10ML) SYRINGE FOR IV PUSH (FOR BLOOD PRESSURE SUPPORT)
PREFILLED_SYRINGE | INTRAVENOUS | Status: DC | PRN
  Administered 2018-11-23 (×3): 80 ug via INTRAVENOUS

## 2018-11-23 MED ORDER — PANTOPRAZOLE SODIUM 40 MG PO TBEC
40.0000 mg | DELAYED_RELEASE_TABLET | Freq: Every day | ORAL | Status: DC
  Administered 2018-11-23 – 2018-11-24 (×2): 40 mg via ORAL
  Filled 2018-11-23 (×2): qty 1

## 2018-11-23 MED ORDER — MIDAZOLAM HCL 2 MG/2ML IJ SOLN
INTRAMUSCULAR | Status: AC
  Filled 2018-11-23: qty 2

## 2018-11-23 MED ORDER — STERILE WATER FOR IRRIGATION IR SOLN
Status: DC | PRN
  Administered 2018-11-23: 2000 mL

## 2018-11-23 MED ORDER — PROPOFOL 500 MG/50ML IV EMUL
INTRAVENOUS | Status: DC | PRN
  Administered 2018-11-23: 100 ug/kg/min via INTRAVENOUS

## 2018-11-23 MED ORDER — PROPOFOL 10 MG/ML IV BOLUS
INTRAVENOUS | Status: AC
  Filled 2018-11-23: qty 60

## 2018-11-23 MED ORDER — POLYETHYLENE GLYCOL 3350 17 G PO PACK: 17.0000 g | PACK | Freq: Every day | ORAL | Status: DC | PRN

## 2018-11-23 MED ORDER — PROPOFOL 10 MG/ML IV BOLUS
INTRAVENOUS | Status: AC
  Filled 2018-11-23: qty 20

## 2018-11-23 MED ORDER — METHOCARBAMOL 500 MG PO TABS
500.0000 mg | ORAL_TABLET | Freq: Four times a day (QID) | ORAL | Status: DC | PRN
  Filled 2018-11-23: qty 1

## 2018-11-23 MED ORDER — BUPIVACAINE IN DEXTROSE 0.75-8.25 % IT SOLN
INTRATHECAL | Status: DC | PRN
  Administered 2018-11-23: 1.6 mL via INTRATHECAL

## 2018-11-23 MED ORDER — ONDANSETRON HCL 4 MG/2ML IJ SOLN
INTRAMUSCULAR | Status: AC
  Filled 2018-11-23: qty 2

## 2018-11-23 MED ORDER — PROMETHAZINE HCL 25 MG/ML IJ SOLN: 6.2500 mg | INTRAMUSCULAR | Status: DC | PRN

## 2018-11-23 MED ORDER — OXYCODONE HCL 5 MG PO TABS
5.0000 mg | ORAL_TABLET | ORAL | Status: DC | PRN
  Administered 2018-11-23: 10 mg via ORAL
  Administered 2018-11-23: 5 mg via ORAL
  Administered 2018-11-24 (×3): 10 mg via ORAL
  Filled 2018-11-23: qty 1
  Filled 2018-11-23 (×3): qty 2

## 2018-11-23 MED ORDER — CEFAZOLIN SODIUM-DEXTROSE 2-4 GM/100ML-% IV SOLN
2.0000 g | INTRAVENOUS | Status: AC
  Administered 2018-11-23: 2 g via INTRAVENOUS
  Filled 2018-11-23: qty 100

## 2018-11-23 SURGICAL SUPPLY — 41 items
BAG ZIPLOCK 12X15 (MISCELLANEOUS) IMPLANT
BENZOIN TINCTURE PRP APPL 2/3 (GAUZE/BANDAGES/DRESSINGS) ×3 IMPLANT
BLADE SAW SGTL 18X1.27X75 (BLADE) ×2 IMPLANT
BLADE SAW SGTL 18X1.27X75MM (BLADE) ×1
BLADE SURG SZ10 CARB STEEL (BLADE) ×6 IMPLANT
CLOSURE WOUND 1/2 X4 (GAUZE/BANDAGES/DRESSINGS) ×1
COLLAR OFFSET CORAIL SZ 11 HIP (Stem) ×1 IMPLANT
CORAIL OFFSET COLLAR SZ 11 HIP (Stem) ×3 IMPLANT
COVER PERINEAL POST (MISCELLANEOUS) ×3 IMPLANT
COVER SURGICAL LIGHT HANDLE (MISCELLANEOUS) ×3 IMPLANT
COVER WAND RF STERILE (DRAPES) IMPLANT
CUP ACET PNNCL SECTR W/GRIP 56 (Hips) ×1 IMPLANT
DRAPE STERI IOBAN 125X83 (DRAPES) ×3 IMPLANT
DRAPE U-SHAPE 47X51 STRL (DRAPES) ×6 IMPLANT
DRSG AQUACEL AG ADV 3.5X10 (GAUZE/BANDAGES/DRESSINGS) ×3 IMPLANT
DURAPREP 26ML APPLICATOR (WOUND CARE) ×3 IMPLANT
ELECT REM PT RETURN 15FT ADLT (MISCELLANEOUS) ×3 IMPLANT
GAUZE XEROFORM 1X8 LF (GAUZE/BANDAGES/DRESSINGS) IMPLANT
GLOVE BIO SURGEON STRL SZ7.5 (GLOVE) ×3 IMPLANT
GLOVE BIOGEL PI IND STRL 8 (GLOVE) ×2 IMPLANT
GLOVE BIOGEL PI INDICATOR 8 (GLOVE) ×4
GLOVE ECLIPSE 8.0 STRL XLNG CF (GLOVE) ×3 IMPLANT
GOWN STRL REUS W/TWL XL LVL3 (GOWN DISPOSABLE) ×6 IMPLANT
HANDPIECE INTERPULSE COAX TIP (DISPOSABLE) ×2
HEAD M SROM 36MM 2 (Hips) ×1 IMPLANT
HOLDER FOLEY CATH W/STRAP (MISCELLANEOUS) ×3 IMPLANT
LINER NEUTRAL 52MMX36MMX56N (Liner) ×3 IMPLANT
PACK ANTERIOR HIP CUSTOM (KITS) ×3 IMPLANT
PINN SECTOR W/GRIP ACE CUP 56 (Hips) ×3 IMPLANT
SET HNDPC FAN SPRY TIP SCT (DISPOSABLE) ×1 IMPLANT
SROM M HEAD 36MM 2 (Hips) ×3 IMPLANT
STAPLER VISISTAT 35W (STAPLE) IMPLANT
STRIP CLOSURE SKIN 1/2X4 (GAUZE/BANDAGES/DRESSINGS) ×2 IMPLANT
SUT ETHIBOND NAB CT1 #1 30IN (SUTURE) ×3 IMPLANT
SUT MNCRL AB 4-0 PS2 18 (SUTURE) IMPLANT
SUT VIC AB 0 CT1 36 (SUTURE) ×3 IMPLANT
SUT VIC AB 1 CT1 36 (SUTURE) ×3 IMPLANT
SUT VIC AB 2-0 CT1 27 (SUTURE) ×4
SUT VIC AB 2-0 CT1 TAPERPNT 27 (SUTURE) ×2 IMPLANT
TRAY FOLEY MTR SLVR 16FR STAT (SET/KITS/TRAYS/PACK) ×3 IMPLANT
YANKAUER SUCT BULB TIP 10FT TU (MISCELLANEOUS) ×3 IMPLANT

## 2018-11-23 NOTE — Anesthesia Preprocedure Evaluation (Signed)
Anesthesia Evaluation  Patient identified by MRN, date of birth, ID band Patient awake    Reviewed: Allergy & Precautions, NPO status , Patient's Chart, lab work & pertinent test results  Airway Mallampati: I       Dental no notable dental hx. (+) Teeth Intact   Pulmonary former smoker,    Pulmonary exam normal breath sounds clear to auscultation       Cardiovascular negative cardio ROS Normal cardiovascular exam Rhythm:Regular Rate:Normal     Neuro/Psych negative neurological ROS  negative psych ROS   GI/Hepatic negative GI ROS, (+) Hepatitis -, [id number]   Endo/Other  negative endocrine ROS  Renal/GU negative Renal ROS  negative genitourinary   Musculoskeletal  (+) Arthritis , Osteoarthritis,    Abdominal Normal abdominal exam  (+)   Peds  Hematology   Anesthesia Other Findings   Reproductive/Obstetrics                             Anesthesia Physical Anesthesia Plan  ASA: II  Anesthesia Plan: Spinal   Post-op Pain Management:    Induction:   PONV Risk Score and Plan: 1  Airway Management Planned: Natural Airway, Nasal Cannula and Simple Face Mask  Additional Equipment:   Intra-op Plan:   Post-operative Plan:   Informed Consent: I have reviewed the patients History and Physical, chart, labs and discussed the procedure including the risks, benefits and alternatives for the proposed anesthesia with the patient or authorized representative who has indicated his/her understanding and acceptance.     Plan Discussed with: CRNA  Anesthesia Plan Comments:         Anesthesia Quick Evaluation

## 2018-11-23 NOTE — Anesthesia Postprocedure Evaluation (Signed)
Anesthesia Post Note  Patient: Juan Powers  Procedure(s) Performed: RIGHT TOTAL HIP ARTHROPLASTY ANTERIOR APPROACH (Right Hip)     Patient location during evaluation: PACU Anesthesia Type: Spinal Level of consciousness: awake Pain management: pain level controlled Vital Signs Assessment: post-procedure vital signs reviewed and stable Respiratory status: spontaneous breathing Cardiovascular status: stable Postop Assessment: no apparent nausea or vomiting Anesthetic complications: no    Last Vitals:  Vitals:   11/23/18 1115 11/23/18 1130  BP: 102/61 104/60  Pulse: (!) 51 (!) 52  Resp: 10 10  Temp:  (!) 36.3 C  SpO2: 100% 100%    Last Pain:  Vitals:   11/23/18 1130  TempSrc:   PainSc: 0-No pain   Pain Goal:                 Huston Foley

## 2018-11-23 NOTE — Anesthesia Procedure Notes (Signed)
Spinal  Patient location during procedure: OR Start time: 11/23/2018 8:31 AM End time: 11/23/2018 8:36 AM Staffing Resident/CRNA: Sharlette Dense, CRNA Performed: resident/CRNA  Preanesthetic Checklist Completed: patient identified, site marked, surgical consent, pre-op evaluation, timeout performed, IV checked, risks and benefits discussed and monitors and equipment checked Spinal Block Patient position: sitting Prep: DuraPrep Patient monitoring: heart rate, continuous pulse ox and blood pressure Approach: left paramedian Location: L3-4 Injection technique: single-shot Needle Needle type: Sprotte  Needle gauge: 24 G Needle length: 9 cm Additional Notes Kit expiration date 09/04/2019 and lot # [id number] Clear free flow CSF, negative heme, negative paresthesia Tolerated well and placed back in supine position

## 2018-11-23 NOTE — Brief Op Note (Signed)
11/23/2018  9:45 AM  PATIENT:  Juan Powers  62 y.o. male  PRE-OPERATIVE DIAGNOSIS:  Osteoarthritis Right Hip  POST-OPERATIVE DIAGNOSIS:  Osteoarthritis Right Hip  PROCEDURE:  Procedure(s): RIGHT TOTAL HIP ARTHROPLASTY ANTERIOR APPROACH (Right)  SURGEON:  Surgeon(s) and Role:    Mcarthur Rossetti, MD - Primary  PHYSICIAN ASSISTANT: Benita Stabile, PA-C  ANESTHESIA:   spinal  EBL:  400 mL   COUNTS:  YES   PLAN OF CARE: Admit to inpatient   PATIENT DISPOSITION:  PACU - hemodynamically stable.   Delay start of Pharmacological VTE agent (>24hrs) due to surgical blood loss or risk of bleeding: no

## 2018-11-23 NOTE — Anesthesia Procedure Notes (Signed)
Date/Time: 11/23/2018 8:30 AM Performed by: Sharlette Dense, CRNA Oxygen Delivery Method: Simple face mask

## 2018-11-23 NOTE — H&P (Signed)
TOTAL HIP ADMISSION H&P  Patient is admitted for right total hip arthroplasty.  Subjective:  Chief Complaint: right hip pain  HPI: Juan Powers, 62 y.o. male, has a history of pain and functional disability in the right hip(s) due to arthritis and patient has failed non-surgical conservative treatments for greater than 12 weeks to include NSAID's and/or analgesics, corticosteriod injections, use of assistive devices and activity modification.  Onset of symptoms was gradual starting 3 years ago with gradually worsening course since that time.The patient noted no past surgery on the right hip(s).  Patient currently rates pain in the right hip at 10 out of 10 with activity. Patient has night pain, worsening of pain with activity and weight bearing, trendelenberg gait, pain that interfers with activities of daily living and pain with passive range of motion. Patient has evidence of subchondral cysts, subchondral sclerosis, periarticular osteophytes and joint space narrowing by imaging studies. This condition presents safety issues increasing the risk of falls.  There is no current active infection.  Patient Active Problem List   Diagnosis Date Noted  . Unilateral primary osteoarthritis, right hip 11/23/2018   Past Medical History:  Diagnosis Date  . Anemia    LATE TEENS EARLY 20S   . Arthritis    RIGHT HIP   . Basal cell carcinoma    MULTIPLE OVER THE YEARS  . Hepatitis    HEP A IN THE EARLY 80s , TREATED AND RESOLVED   . Tinnitus     Past Surgical History:  Procedure Laterality Date  . COLONOSCOPY    . TONSILLECTOMY     CHILDHOOD     Current Facility-Administered Medications  Medication Dose Route Frequency Provider Last Rate Last Dose  . ceFAZolin (ANCEF) IVPB 2g/100 mL premix  2 g Intravenous On Call to OR Pete Pelt, PA-C      . chlorhexidine (HIBICLENS) 4 % liquid 4 application  60 mL Topical Once Pete Pelt, PA-C      . lactated ringers infusion   Intravenous  Continuous Lyn Hollingshead, MD 50 mL/hr at 11/23/18 [phone]    . tranexamic acid (CYKLOKAPRON) IVPB 1,000 mg  1,000 mg Intravenous To OR Pete Pelt, PA-C       Allergies  Allergen Reactions  . Other Shortness Of Breath and Swelling    Pan alba    Social History   Tobacco Use  . Smoking status: Former Smoker    Packs/day: 1.00    Years: 20.00    Pack years: 20.00    Types: Cigarettes    Last attempt to quit: 2000    Years since quitting: 19.9  . Smokeless tobacco: Never Used  Substance Use Topics  . Alcohol use: Yes    Alcohol/week: 0.0 standard drinks    Comment: OCC    Family History  Problem Relation Age of Onset  . Cancer Father   . Heart disease Father   . Cancer Sister      Review of Systems  Musculoskeletal: Positive for joint pain.  All other systems reviewed and are negative.   Objective:  Physical Exam  Constitutional: He is oriented to person, place, and time. He appears well-developed and well-nourished.  HENT:  Head: Normocephalic and atraumatic.  Eyes: Pupils are equal, round, and reactive to light. EOM are normal.  Neck: Normal range of motion. Neck supple.  Cardiovascular: Normal rate.  Respiratory: Effort normal and breath sounds normal.  GI: Soft. Bowel sounds are normal.  Musculoskeletal:  Right hip: He exhibits decreased range of motion, decreased strength, tenderness and bony tenderness.  Neurological: He is alert and oriented to person, place, and time.  Skin: Skin is warm and dry.  Psychiatric: He has a normal mood and affect.    Vital signs in last 24 hours: Temp:  [98.9 F (37.2 C)] 98.9 F (37.2 C) (12/20 0620) Pulse Rate:  [58] 58 (12/20 0620) Resp:  [18] 18 (12/20 0620) BP: (124)/(75) 124/75 (12/20 0620) SpO2:  [100 %] 100 % (12/20 0620)  Labs:   Estimated body mass index is 25.81 kg/m as calculated from the following:   Height as of 11/19/18: 6\' 2"  (1.88 m).   Weight as of 11/19/18: 91.2 kg.   Imaging  Review Plain radiographs demonstrate severe degenerative joint disease of the right hip(s). The bone quality appears to be excellent for age and reported activity level.    Preoperative templating of the joint replacement has been completed, documented, and submitted to the Operating Room personnel in order to optimize intra-operative equipment management.     Assessment/Plan:  End stage arthritis, right hip(s)  The patient history, physical examination, clinical judgement of the provider and imaging studies are consistent with end stage degenerative joint disease of the right hip(s) and total hip arthroplasty is deemed medically necessary. The treatment options including medical management, injection therapy, arthroscopy and arthroplasty were discussed at length. The risks and benefits of total hip arthroplasty were presented and reviewed. The risks due to aseptic loosening, infection, stiffness, dislocation/subluxation,  thromboembolic complications and other imponderables were discussed.  The patient acknowledged the explanation, agreed to proceed with the plan and consent was signed. Patient is being admitted for inpatient treatment for surgery, pain control, PT, OT, prophylactic antibiotics, VTE prophylaxis, progressive ambulation and ADL's and discharge planning.The patient is planning to be discharged home with home health services

## 2018-11-23 NOTE — Op Note (Signed)
NAME: Juan Powers, SOLE MEDICAL RECORD IR:[id number] ACCOUNT [id number] DATE OF BIRTH:[birth year] FACILITY: [facility] LOCATION: [facility] PHYSICIAN:Aliviyah Malanga Kerry Fort, MD  OPERATIVE REPORT  DATE OF PROCEDURE:  11/23/2018  PREOPERATIVE DIAGNOSIS:  Primary osteoarthritis and degenerative joint disease, right hip.  POSTOPERATIVE DIAGNOSIS:  Primary osteoarthritis and degenerative joint disease, right hip.  PROCEDURE:  Right total hip arthroplasty through direct anterior approach.  IMPLANTS:  DePuy Sector Gription acetabular component size 56, size 36+0 neutral polyethylene liner for a size 56 acetabular component, size 11 Corail femoral component with high offset, size 36 -2 metal hip ball.  SURGEON:  Lind Guest. Ninfa Linden, MD  ASSISTANT:  Erskine Emery, PA-C.  ANESTHESIA:  Spinal.  ANTIBIOTICS:  Two grams IV Ancef.  ESTIMATED BLOOD LOSS:  063 mL  COMPLICATIONS:  None.  INDICATIONS:  The patient is a 62 year old gentleman with debilitating arthritis involving both his hips with the right much worse than the left.   At this point, he has tried and failed all forms of conservative treatment.  His right hip pain is daily  and has detrimentally affected his activities of daily living, his quality of life and his mobility.  At this point, he does wish to proceed with a right total hip arthroplasty through direct anterior approach.  We talked about the risk of acute blood  loss anemia, nerve or vessel injury, fracture, infection, dislocation, DVT and implant failure.  We talked about the goals of decreased pain, improve mobility and overall improve quality of life.  DESCRIPTION OF PROCEDURE:  After informed consent was obtained and appropriate right hip was marked, he was brought to the operating room and sat up on a stretcher.  Spinal anesthesia was obtained.  He was placed in supine position on a stretcher.  Foley  catheter was placed and both feet had traction boots applied to them.   Next, he was placed supine on the Hana fracture table, the perineal post in place and both legs in line skeletal traction device and no traction applied.  His right operative hip was  prepped and draped with DuraPrep and sterile drapes.  A time-out was called to identify correct patient, correct right hip.  We then made an incision just inferior and posterior to the anterior superior iliac spine and carried this obliquely down the  leg.  We dissected down tensor fascia lata muscle.  Tensor fascia was then divided longitudinally to proceed with direct anterior approach to the hip.  We identified and cauterized circumflex vessels and identified the hip capsule, opened the hip capsule  in an L-type format, finding moderate joint effusion and significant periarticular osteophytes around the right hip.  We placed Cobra retractors around the medial and lateral femoral neck and then made our femoral neck cut with an oscillating saw  proximal to the lesser trochanter completing this with an osteotome.  We placed a corkscrew guide in the femoral head and removed the femoral head in its entirety and found a wide area devoid of cartilage.  I then placed a bent Hohmann over the medial  acetabular rim and began removing remnants of the acetabular labrum and other debris.  We then began reaming from a size 44 reamer in stepwise increments up to a size 55 with all reamers under direct visualization, the last reamer under direct  fluoroscopy, so we could obtain our depth in reaming our inclination and anteversion.  I then placed the real DePuy Sector Gription acetabular component size 56 and a 36+0 neutral polyethylene liner for that  size acetabular component.  Attention was then  turned to the femur.  With the leg externally rotated to 120 degrees, extended and adducted, we were able to place a Mueller retractor medially and a Hohmann retractor around the greater trochanter, released lateral joint capsule and used a  box-cutting  osteotome to enter femoral canal and a rongeur to lateralize and then began broaching from a size 8 broach using Corail broaching system going up to a size 11.  With a size 11 in place, we trialed a standard offset femoral neck and a 36-2 hip ball,  reduced this in the acetabulum and we felt like he needed just a little bit more offset, but no additional leg length.  We dislocated the hip and removed the trial components.  We placed the real high-offset Corail femoral component size 11 and the real  36-2 metal hip ball, reduced this in the acetabulum.  We were pleased with his offset, range of motion and stability and leg length assessed visually, manually and under fluoroscopy.  We then irrigated the soft tissue with normal saline solution using  pulsatile lavage.  We closed the joint capsule with interrupted #1 Ethibond suture, followed by interrupted Vicryl and tensor fascia, 0 Vicryl in the deep tissue, 2-0 Vicryl subcutaneous tissue, 4-0 Monocryl subcuticular stitch and Steri-Strips on the  skin.  An Aquacel dressing was applied.  He was taken off the Hana table and taken to recovery room in stable condition.  All final counts were correct.  There were no complications noted.  Of note, Benita Stabile, PA-C, assisted the entire case.  His  assistance was crucial for facilitating all aspects of this case.  TN/NUANCE  D:11/23/2018 T:11/23/2018 JOB:[id number]

## 2018-11-23 NOTE — Transfer of Care (Signed)
Immediate Anesthesia Transfer of Care Note  Patient: JB DULWORTH  Procedure(s) Performed: RIGHT TOTAL HIP ARTHROPLASTY ANTERIOR APPROACH (Right Hip)  Patient Location: PACU  Anesthesia Type:Spinal  Level of Consciousness: drowsy  Airway & Oxygen Therapy: Patient Spontanous Breathing and Patient connected to face mask oxygen  Post-op Assessment: Report given to RN and Post -op Vital signs reviewed and stable  Post vital signs: Reviewed and stable  Last Vitals:  Vitals Value Taken Time  BP 105/66 11/23/2018 10:04 AM  Temp    Pulse 75 11/23/2018 10:05 AM  Resp 14 11/23/2018 10:05 AM  SpO2 99 % 11/23/2018 10:05 AM  Vitals shown include unvalidated device data.  Last Pain:  Vitals:   11/23/18 0620  TempSrc: Oral         Complications: No apparent anesthesia complications

## 2018-11-23 NOTE — Plan of Care (Signed)
Plan of care 

## 2018-11-23 NOTE — Evaluation (Signed)
Physical Therapy Evaluation Patient Details Name: Juan Powers MRN: [id number] DOB: 04-06-1956 Today's Date: 11/23/2018   History of Present Illness  Pt s/p   R THR  Clinical Impression  Pt s/p R THR and presents with decreased R LE strength/ROM and post op pain limiting functional mobility.  Pt should progress well to dc home with family assist.    Follow Up Recommendations Follow surgeon's recommendation for DC plan and follow-up therapies    Equipment Recommendations  None recommended by PT    Recommendations for Other Services       Precautions / Restrictions Precautions Precautions: Fall Restrictions Weight Bearing Restrictions: No Other Position/Activity Restrictions: WBAT      Mobility  Bed Mobility Overal bed mobility: Needs Assistance Bed Mobility: Supine to Sit     Supine to sit: Min assist     General bed mobility comments: cues for sequence and use of L LE to self assist  Transfers Overall transfer level: Needs assistance Equipment used: Rolling walker (2 wheeled) Transfers: Sit to/from Stand Sit to Stand: Min assist         General transfer comment: cues for LE management and use of UEs to self assist  Ambulation/Gait Ambulation/Gait assistance: Min assist Gait Distance (Feet): 180 Feet Assistive device: Rolling walker (2 wheeled) Gait Pattern/deviations: Step-to pattern;Step-through pattern;Decreased step length - right;Decreased step length - left;Shuffle;Trunk flexed Gait velocity: decr   General Gait Details: cues for posture, position from RW and initial sequence  Stairs            Wheelchair Mobility    Modified Rankin (Stroke Patients Only)       Balance Overall balance assessment: Mild deficits observed, not formally tested                                           Pertinent Vitals/Pain Pain Assessment: 0-10 Pain Score: 4  Pain Location: R hip Pain Descriptors / Indicators: Aching;Sore Pain  Intervention(s): Limited activity within patient's tolerance;Monitored during session;Premedicated before session;Ice applied    Home Living Family/patient expects to be discharged to:: Private residence Living Arrangements: Spouse/significant other Available Help at Discharge: Family Type of Home: House Home Access: Stairs to enter Entrance Stairs-Rails: None Entrance Stairs-Number of Steps: 3 Home Layout: Able to live on main level with bedroom/bathroom Home Equipment: Walker - 2 wheels;Bedside commode;Shower seat      Prior Function Level of Independence: Independent         Comments: Worked for [employer] for [employer]     Hand Dominance        Extremity/Trunk Assessment   Upper Extremity Assessment Upper Extremity Assessment: Overall WFL for tasks assessed    Lower Extremity Assessment Lower Extremity Assessment: RLE deficits/detail    Cervical / Trunk Assessment Cervical / Trunk Assessment: Normal  Communication   Communication: No difficulties  Cognition Arousal/Alertness: Awake/alert Behavior During Therapy: WFL for tasks assessed/performed Overall Cognitive Status: Within Functional Limits for tasks assessed                                        General Comments      Exercises Total Joint Exercises Ankle Circles/Pumps: AROM;Both;15 reps;Supine   Assessment/Plan    PT Assessment Patient needs continued PT services  PT Problem List Decreased strength;Decreased range  of motion;Decreased activity tolerance;Decreased mobility;Decreased knowledge of use of DME;Pain       PT Treatment Interventions DME instruction;Gait training;Stair training;Functional mobility training;Therapeutic activities;Therapeutic exercise;Patient/family education    PT Goals (Current goals can be found in the Care Plan section)  Acute Rehab PT Goals Patient Stated Goal: Regain IND PT Goal Formulation: With patient Time For Goal Achievement: 11/30/18 Potential  to Achieve Goals: Good    Frequency 7X/week   Barriers to discharge        Co-evaluation               AM-PAC PT "6 Clicks" Mobility  Outcome Measure Help needed turning from your back to your side while in a flat bed without using bedrails?: A Little Help needed moving from lying on your back to sitting on the side of a flat bed without using bedrails?: A Little Help needed moving to and from a bed to a chair (including a wheelchair)?: A Little Help needed standing up from a chair using your arms (e.g., wheelchair or bedside chair)?: A Little Help needed to walk in hospital room?: A Little Help needed climbing 3-5 steps with a railing? : A Little 6 Click Score: 18    End of Session Equipment Utilized During Treatment: Gait belt Activity Tolerance: Patient tolerated treatment well Patient left: in chair;with call bell/phone within reach;with family/visitor present Nurse Communication: Mobility status PT Visit Diagnosis: Difficulty in walking, not elsewhere classified (R26.2)    Time: 1364-3837 PT Time Calculation (min) (ACUTE ONLY): 26 min   Charges:   PT Evaluation $PT Eval Low Complexity: 1 Low PT Treatments $Gait Training: 8-22 mins        [place] Pager [phone] Office [phone]   Glennice Marcos 11/23/2018, 5:13 PM

## 2018-11-24 LAB — CBC
HCT: 36 % — ABNORMAL LOW (ref 39.0–52.0)
Hemoglobin: 11.4 g/dL — ABNORMAL LOW (ref 13.0–17.0)
MCH: 30.1 pg (ref 26.0–34.0)
MCHC: 31.7 g/dL (ref 30.0–36.0)
MCV: 95 fL (ref 80.0–100.0)
Platelets: 250 10*3/uL (ref 150–400)
RBC: 3.79 MIL/uL — ABNORMAL LOW (ref 4.22–5.81)
RDW: 12.2 % (ref 11.5–15.5)
WBC: 10.6 10*3/uL — ABNORMAL HIGH (ref 4.0–10.5)
nRBC: 0 % (ref 0.0–0.2)

## 2018-11-24 LAB — BASIC METABOLIC PANEL
Anion gap: 10 (ref 5–15)
BUN: 17 mg/dL (ref 8–23)
CO2: 23 mmol/L (ref 22–32)
Calcium: 7.9 mg/dL — ABNORMAL LOW (ref 8.9–10.3)
Chloride: 106 mmol/L (ref 98–111)
Creatinine, Ser: 1.05 mg/dL (ref 0.61–1.24)
GFR calc Af Amer: >60 mL/min (ref >60)
GFR calc non Af Amer: >60 mL/min (ref >60)
Glucose, Bld: 146 mg/dL — ABNORMAL HIGH (ref 70–99)
Potassium: 4.3 mmol/L (ref 3.5–5.1)
Sodium: 139 mmol/L (ref 135–145)

## 2018-11-24 MED ORDER — OXYCODONE HCL 5 MG PO TABS: 5.0000 mg | ORAL_TABLET | ORAL | 0 refills | Status: DC | PRN

## 2018-11-24 MED ORDER — METHOCARBAMOL 500 MG PO TABS: 500.0000 mg | ORAL_TABLET | Freq: Four times a day (QID) | ORAL | 0 refills | Status: DC | PRN

## 2018-11-24 MED ORDER — ASPIRIN 81 MG PO CHEW: 81.0000 mg | CHEWABLE_TABLET | Freq: Two times a day (BID) | ORAL | 0 refills | Status: DC

## 2018-11-24 NOTE — Evaluation (Signed)
Occupational Therapy Evaluation Patient Details Name: Juan Powers MRN: [id number] DOB: 01-16-1956 Today's Date: 11/24/2018    History of Present Illness Pt s/p   R THR   Clinical Impression   OT education complete. Wife will A as needed             Precautions / Restrictions Precautions Precautions: Fall Restrictions Weight Bearing Restrictions: No Other Position/Activity Restrictions: WBAT      Mobility Bed Mobility Overal bed mobility: Needs Assistance Bed Mobility: Supine to Sit;Sit to Supine     Supine to sit: Min guard Sit to supine: Min guard   General bed mobility comments: cues for sequence and use of L LE to self assist  Transfers Overall transfer level: Needs assistance Equipment used: Rolling walker (2 wheeled) Transfers: Sit to/from [facility] Sit to Stand: Supervision Stand pivot transfers: Supervision       General transfer comment: cues for LE management and use of UEs to self assist    Balance Overall balance assessment: Mild deficits observed, not formally tested                                         ADL either performed or assessed with clinical judgement   ADL Overall ADL's : Needs assistance/impaired Eating/Feeding: Set up;Sitting   Grooming: Standing;Supervision/safety   Upper Body Bathing: Set up;Sitting   Lower Body Bathing: Minimal assistance;Cueing for sequencing;Cueing for safety   Upper Body Dressing : Set up;Sitting   Lower Body Dressing: Minimal assistance;Sit to/from stand;Cueing for safety;Cueing for sequencing   Toilet Transfer: Min guard;Ambulation;Regular Toilet;Comfort height toilet   Toileting- Clothing Manipulation and Hygiene: Supervision/safety;Sit to/from Nurse, children's Details (indicate cue type and reason): verbalized safety   General ADL Comments: wife will A as needed.  Education regarding safety with ADL activity complete.     Vision Patient  Visual Report: No change from baseline       Perception     Praxis      Pertinent Vitals/Pain Pain Assessment: 0-10 Pain Score: 3  Pain Location: R hip Pain Descriptors / Indicators: Aching;Sore Pain Intervention(s): Limited activity within patient's tolerance;Monitored during session     Hand Dominance     Extremity/Trunk Assessment         Cervical / Trunk Assessment Cervical / Trunk Assessment: Normal   Communication Communication Communication: No difficulties   Cognition Arousal/Alertness: Awake/alert Behavior During Therapy: WFL for tasks assessed/performed Overall Cognitive Status: Within Functional Limits for tasks assessed                                        Exercises   Shoulder Instructions     Home Living Family/patient expects to be discharged to:: Private residence Living Arrangements: Spouse/significant other Available Help at Discharge: Family Type of Home: House Home Access: Stairs to enter Technical brewer of Steps: 3 Entrance Stairs-Rails: None Home Layout: Able to live on main level with bedroom/bathroom     Bathroom Shower/Tub: Occupational psychologist: Handicapped height     Home Equipment: Environmental consultant - 2 wheels;Bedside commode;Shower seat          Prior Functioning/Environment Level of Independence: Independent        Comments: Worked for [employer] for [employer]  OT Goals(Current goals can be found in the care plan section) Acute Rehab OT Goals Patient Stated Goal: Regain IND  OT Frequency:      AM-PAC OT "6 Clicks" Daily Activity     Outcome Measure Help from another person eating meals?: None Help from another person taking care of personal grooming?: None Help from another person toileting, which includes using toliet, bedpan, or urinal?: A Little Help from another person bathing (including washing, rinsing, drying)?: A Little Help from another person to put on and taking  off regular upper body clothing?: None Help from another person to put on and taking off regular lower body clothing?: A Little 6 Click Score: 21   End of Session Equipment Utilized During Treatment: Rolling walker Nurse Communication: Mobility status  Activity Tolerance: Patient tolerated treatment well Patient left: in bed;with call bell/phone within reach;with family/visitor present                   Time: 1015-1040 OT Time Calculation (min): 25 min Charges:  OT General Charges $OT Visit: 1 Visit OT Evaluation $OT Eval Low Complexity: 1 Low OT Treatments $Self Care/Home Management : 8-22 mins  Kari Baars, OT Acute Rehabilitation Services [phone] Office- [phone]     Logann Whitebread, Edwena Felty D 11/24/2018, 1:37 PM

## 2018-11-24 NOTE — Discharge Summary (Signed)
Patient ID: CORNELUIS ALLSTON MRN: [id number] DOB/AGE: 1956/08/29 62 y.o.  Admit date: 11/23/2018 Discharge date: 11/24/2018  Admission Diagnoses:  Principal Problem:   Unilateral primary osteoarthritis, right hip Active Problems:   Status post total replacement of right hip   Discharge Diagnoses:  Same  Past Medical History:  Diagnosis Date  . Anemia    LATE TEENS EARLY 20S   . Arthritis    RIGHT HIP   . Basal cell carcinoma    MULTIPLE OVER THE YEARS  . Hepatitis    HEP A IN THE EARLY 80s , TREATED AND RESOLVED   . Tinnitus     Surgeries: Procedure(s): RIGHT TOTAL HIP ARTHROPLASTY ANTERIOR APPROACH on 11/23/2018   Consultants:   Discharged Condition: Improved  Hospital Course: IDAN PRIME is an 61 y.o. male who was admitted 11/23/2018 for operative treatment ofUnilateral primary osteoarthritis, right hip. Patient has severe unremitting pain that affects sleep, daily activities, and work/hobbies. After pre-op clearance the patient was taken to the operating room on 11/23/2018 and underwent  Procedure(s): RIGHT TOTAL HIP ARTHROPLASTY ANTERIOR APPROACH.    Patient was given perioperative antibiotics:  Anti-infectives (From admission, onward)   Start     Dose/Rate Route Frequency Ordered Stop   11/23/18 1500  ceFAZolin (ANCEF) IVPB 1 g/50 mL premix     1 g 100 mL/hr over 30 Minutes Intravenous Every 6 hours 11/23/18 1153 11/23/18 2104   11/23/18 0615  ceFAZolin (ANCEF) IVPB 2g/100 mL premix     2 g 200 mL/hr over 30 Minutes Intravenous On call to O.R. 11/23/18 5462 11/23/18 7035       Patient was given sequential compression devices, early ambulation, and chemoprophylaxis to prevent DVT.  Patient benefited maximally from hospital stay and there were no complications.    Recent vital signs:  Patient Vitals for the past 24 hrs:  BP Temp Temp src Pulse Resp SpO2 Height Weight  11/24/18 0501 112/63 98.6 F (37 C) - 75 16 96 % - -  11/24/18 0154 102/61 98.8 F  (37.1 C) - 61 16 94 % - -  11/23/18 2114 111/63 99 F (37.2 C) - 73 16 95 % - -  11/23/18 1746 115/72 98.1 F (36.7 C) Oral (!) 56 18 96 % - -  11/23/18 1525 115/67 97.6 F (36.4 C) Oral (!) 52 16 99 % - -  11/23/18 1412 116/75 (!) 97.4 F (36.3 C) Oral 66 18 96 % - -  11/23/18 1315 112/71 97.7 F (36.5 C) - 63 16 98 % - -  11/23/18 1146 103/65 (!) 97.5 F (36.4 C) Oral (!) 50 12 100 % - -  11/23/18 1146 103/65 (!) 97.5 F (36.4 C) Oral (!) 50 12 100 % 6\' 2"  (1.88 m) 91 kg  11/23/18 1130 104/60 (!) 97.4 F (36.3 C) - (!) 52 10 100 % - -  11/23/18 1115 102/61 - - (!) 51 10 100 % - -  11/23/18 1100 97/65 (!) 97.4 F (36.3 C) - (!) 55 16 100 % - -  11/23/18 1045 94/61 - - (!) 58 17 100 % - -  11/23/18 1030 98/68 - - (!) 56 11 100 % - -  11/23/18 1015 101/66 - - 67 11 100 % - -  11/23/18 1004 105/66 97.7 F (36.5 C) - 75 10 98 % - -     Recent laboratory studies:  Recent Labs    11/24/18 0506  WBC 10.6*  HGB 11.4*  HCT 36.0*  PLT 250  NA 139  K 4.3  CL 106  CO2 23  BUN 17  CREATININE 1.05  GLUCOSE 146*  CALCIUM 7.9*     Discharge Medications:   Allergies as of 11/24/2018      Reactions   Other Shortness Of Breath, Swelling   Pan alba      Medication List    TAKE these medications   aspirin 81 MG chewable tablet Chew 1 tablet (81 mg total) by mouth 2 (two) times daily.   cetirizine 10 MG tablet Commonly known as:  ZYRTEC Take 10 mg by mouth daily as needed for allergies.   ibuprofen 200 MG tablet Commonly known as:  ADVIL,MOTRIN Take 600 mg by mouth 2 (two) times daily as needed for moderate pain.   methocarbamol 500 MG tablet Commonly known as:  ROBAXIN Take 1 tablet (500 mg total) by mouth every 6 (six) hours as needed for muscle spasms.   multivitamin with minerals Tabs tablet Take 1 tablet by mouth daily.   oxyCODONE 5 MG immediate release tablet Commonly known as:  Oxy IR/ROXICODONE Take 1-2 tablets (5-10 mg total) by mouth every 4 (four)  hours as needed for moderate pain (pain score 4-6).            Durable Medical Equipment  (From admission, onward)         Start     Ordered   11/23/18 1154  DME Walker rolling  Once    Question:  Patient needs a walker to treat with the following condition  Answer:  Status post total replacement of right hip   11/23/18 1153   11/23/18 1154  DME 3 n 1  Once     11/23/18 1153          Diagnostic Studies: Dg Pelvis Portable  Result Date: 11/23/2018 CLINICAL DATA:  Post right total hip replacement EXAM: PORTABLE PELVIS 1-2 VIEWS COMPARISON:  None. FINDINGS: Changes of right hip replacement. Normal AP alignment. No hardware or bony complicating feature. IMPRESSION: Right hip replacement.  No visible complicating feature. Electronically Signed   By: Rolm Baptise M.D.   On: 11/23/2018 10:34   Dg C-arm 1-60 Min-no Report  Result Date: 11/23/2018 Fluoroscopy was utilized by the requesting physician.  No radiographic interpretation.   Dg Hip Operative Unilat W Or W/o Pelvis Right  Result Date: 11/23/2018 CLINICAL DATA:  Right hip replacement. EXAM: OPERATIVE RIGHT HIP (WITH PELVIS IF PERFORMED) 2 VIEWS TECHNIQUE: Fluoroscopic spot image(s) were submitted for interpretation post-operatively. COMPARISON:  06/11/2018 FINDINGS: 2 intraoperative fluoroscopic images demonstrate interval performance of a right total hip arthroplasty. The prosthetic components are normally located on these limited images, and no acute fracture is identified. IMPRESSION: Intraoperative images during right total hip arthroplasty. Electronically Signed   By: Logan Bores M.D.   On: 11/23/2018 10:10    Disposition: Discharge disposition: 01-Home or Self Care         Follow-up Information    Mcarthur Rossetti, MD. Schedule an appointment as soon as possible for a visit in 2 week(s).   Specialty:  Orthopedic Surgery Contact information: [place]  [address] [phone]            Signed: Mcarthur Rossetti 11/24/2018, 8:15 AM

## 2018-11-24 NOTE — Care Management Note (Signed)
Case Management Note  Patient Details  Name: OBADIAH DENNARD MRN: [id number] Date of Birth: 02-26-1956  Subjective/Objective:  Right THA                  Action/Plan: NCM spoke to pt and explained [facility] will arrange [facility]. Provided contact information on dc instructions. States he has RW and bedside commode at home.   Expected Discharge Date:  11/24/18               Expected Discharge Plan:  [place]  In-House Referral:  NA  Discharge planning Services  CM Consult  Post Acute Care Choice:  Home Health Choice offered to:  Patient  DME Arranged:  N/A DME Agency:  NA  HH Arranged:  PT [place] Agency:  Other - See comment  Status of Service:  Completed, signed off  If discussed at [place] of Stay Meetings, dates discussed:    Additional Comments:  Erenest Rasher, RN 11/24/2018, 12:42 PM

## 2018-11-24 NOTE — Progress Notes (Signed)
Subjective: 1 Day Post-Op Procedure(s) (LRB): RIGHT TOTAL HIP ARTHROPLASTY ANTERIOR APPROACH (Right) Patient reports pain as moderate.    Objective: Vital signs in last 24 hours: Temp:  [97.4 F (36.3 C)-99 F (37.2 C)] 98.6 F (37 C) (12/21 0501) Pulse Rate:  [50-75] 75 (12/21 0501) Resp:  [10-18] 16 (12/21 0501) BP: (94-116)/(60-75) 112/63 (12/21 0501) SpO2:  [94 %-100 %] 96 % (12/21 0501) Weight:  [91 kg] 91 kg (12/20 1146)  Intake/Output from previous day: 12/20 0701 - 12/21 0700 In: 4825.5 [P.O.:800; I.V.:3720.5; IV Piggyback:305] Out: 3115 [Urine:2715; Blood:400] Intake/Output this shift: No intake/output data recorded.  Recent Labs    11/24/18 0506  HGB 11.4*   Recent Labs    11/24/18 0506  WBC 10.6*  RBC 3.79*  HCT 36.0*  PLT 250   Recent Labs    11/24/18 0506  NA 139  K 4.3  CL 106  CO2 23  BUN 17  CREATININE 1.05  GLUCOSE 146*  CALCIUM 7.9*   No results for input(s): LABPT, INR in the last 72 hours.  Sensation intact distally Intact pulses distally Dorsiflexion/Plantar flexion intact Incision: scant drainage   Assessment/Plan: 1 Day Post-Op Procedure(s) (LRB): RIGHT TOTAL HIP ARTHROPLASTY ANTERIOR APPROACH (Right) D/C IV fluids Discharge home with home health this afternoon if progressing well.    Mcarthur Rossetti 11/24/2018, 8:12 AM

## 2018-11-24 NOTE — Progress Notes (Signed)
Physical Therapy Treatment Patient Details Name: Juan Powers MRN: [id number] DOB: 08-23-1956 Today's Date: 11/24/2018    History of Present Illness Pt s/p   R THR    PT Comments    Pt progressing well with mobility and eager for dc home.  Pt reviewed car transfers, stairs, and home therex with written instruction provided.   Follow Up Recommendations  Follow surgeon's recommendation for DC plan and follow-up therapies     Equipment Recommendations  None recommended by PT    Recommendations for Other Services       Precautions / Restrictions Precautions Precautions: Fall Restrictions Weight Bearing Restrictions: No Other Position/Activity Restrictions: WBAT    Mobility  Bed Mobility Overal bed mobility: Needs Assistance Bed Mobility: Supine to Sit;Sit to Supine     Supine to sit: Min guard Sit to supine: Min guard   General bed mobility comments: cues for sequence and use of L LE to self assist  Transfers Overall transfer level: Needs assistance Equipment used: Rolling walker (2 wheeled) Transfers: Sit to/from Stand Sit to Stand: Supervision Stand pivot transfers: Supervision       General transfer comment: cues for LE management and use of UEs to self assist  Ambulation/Gait Ambulation/Gait assistance: Min guard;Supervision Gait Distance (Feet): 250 Feet Assistive device: Rolling walker (2 wheeled) Gait Pattern/deviations: Step-through pattern;Shuffle;Trunk flexed Gait velocity: decr   General Gait Details: cues for posture, position from RW and initial sequence   Stairs Stairs: Yes Stairs assistance: Min assist Stair Management: No rails;Step to pattern;Forwards;With walker Number of Stairs: 2 General stair comments: 2 stairs twice - fwd up with RW at top of stairs and using doorframe.  fwd down with RW ; cues for sequence and foot/RW placement   Wheelchair Mobility    Modified Rankin (Stroke Patients Only)       Balance Overall balance  assessment: Mild deficits observed, not formally tested                                          Cognition Arousal/Alertness: Awake/alert Behavior During Therapy: WFL for tasks assessed/performed Overall Cognitive Status: Within Functional Limits for tasks assessed                                        Exercises Total Joint Exercises Ankle Circles/Pumps: AROM;Both;15 reps;Supine Quad Sets: AROM;Both;10 reps;Supine Heel Slides: AAROM;Right;20 reps;Supine Hip ABduction/ADduction: AAROM;Right;15 reps;Supine Long Arc Quad: AROM;Right;10 reps;Seated    General Comments        Pertinent Vitals/Pain Pain Assessment: 0-10 Pain Score: 3  Pain Location: R hip Pain Descriptors / Indicators: Aching;Sore Pain Intervention(s): Limited activity within patient's tolerance;Monitored during session;Premedicated before session    [place] expects to be discharged to:: Private residence Living Arrangements: Spouse/significant other Available Help at Discharge: Family Type of Home: House Home Access: Stairs to enter Entrance Stairs-Rails: None Home Layout: Able to live on main level with bedroom/bathroom Home Equipment: Environmental consultant - 2 wheels;Bedside commode;Shower seat      Prior Function Level of Independence: Independent      Comments: Worked for [employer]   PT Goals (current goals can now be found in the care plan section) Acute Rehab PT Goals Patient Stated Goal: Regain IND PT Goal Formulation: With patient Time For Goal Achievement: 11/30/18  Potential to Achieve Goals: Good Progress towards PT goals: Progressing toward goals    Frequency    7X/week      PT Plan Current plan remains appropriate    Co-evaluation              AM-PAC PT "6 Clicks" Mobility   Outcome Measure  Help needed turning from your back to your side while in a flat bed without using bedrails?: A Little Help needed moving from lying  on your back to sitting on the side of a flat bed without using bedrails?: A Little Help needed moving to and from a bed to a chair (including a wheelchair)?: A Little Help needed standing up from a chair using your arms (e.g., wheelchair or bedside chair)?: A Little Help needed to walk in hospital room?: A Little Help needed climbing 3-5 steps with a railing? : A Little 6 Click Score: 18    End of Session Equipment Utilized During Treatment: Gait belt Activity Tolerance: Patient tolerated treatment well Patient left: with call bell/phone within reach;with family/visitor present;in bed Nurse Communication: Mobility status PT Visit Diagnosis: Difficulty in walking, not elsewhere classified (R26.2)     Time: 1250-1330 PT Time Calculation (min) (ACUTE ONLY): 40 min  Charges:  $Gait Training: 8-22 mins $Therapeutic Exercise: 8-22 mins $Therapeutic Activity: 8-22 mins                     Debe Coder PT Acute Rehabilitation Services Pager [phone] Office [phone]    Laniah Grimm 11/24/2018, 2:01 PM

## 2018-11-24 NOTE — Discharge Instructions (Signed)

## 2018-11-24 NOTE — Progress Notes (Signed)
Physical Therapy Treatment Patient Details Name: Juan Powers MRN: [id number] DOB: 1956/05/20 Today's Date: 11/24/2018    History of Present Illness Pt s/p   R THR    PT Comments    Pt motivated and progressing well with mobility.  Pt hopeful for dc home this pm.   Follow Up Recommendations  Follow surgeon's recommendation for DC plan and follow-up therapies     Equipment Recommendations  None recommended by PT    Recommendations for Other Services       Precautions / Restrictions Precautions Precautions: Fall Restrictions Weight Bearing Restrictions: No Other Position/Activity Restrictions: WBAT    Mobility  Bed Mobility Overal bed mobility: Needs Assistance Bed Mobility: Supine to Sit     Supine to sit: Min guard     General bed mobility comments: cues for sequence and use of L LE to self assist  Transfers Overall transfer level: Needs assistance Equipment used: Rolling walker (2 wheeled) Transfers: Sit to/from Stand Sit to Stand: Min guard         General transfer comment: cues for LE management and use of UEs to self assist  Ambulation/Gait Ambulation/Gait assistance: Min guard Gait Distance (Feet): 400 Feet Assistive device: Rolling walker (2 wheeled) Gait Pattern/deviations: Step-through pattern;Shuffle;Trunk flexed Gait velocity: decr   General Gait Details: cues for posture, position from RW and initial sequence   Stairs             Wheelchair Mobility    Modified Rankin (Stroke Patients Only)       Balance Overall balance assessment: Mild deficits observed, not formally tested                                          Cognition Arousal/Alertness: Awake/alert Behavior During Therapy: WFL for tasks assessed/performed Overall Cognitive Status: Within Functional Limits for tasks assessed                                        Exercises Total Joint Exercises Ankle Circles/Pumps:  AROM;Both;15 reps;Supine Quad Sets: AROM;Both;10 reps;Supine Heel Slides: AAROM;Right;20 reps;Supine Hip ABduction/ADduction: AAROM;Right;15 reps;Supine    General Comments        Pertinent Vitals/Pain Pain Assessment: 0-10 Pain Score: 4  Pain Location: R hip Pain Descriptors / Indicators: Aching;Sore Pain Intervention(s): Limited activity within patient's tolerance;Monitored during session;Premedicated before session;Ice applied    Home Living                      Prior Function            PT Goals (current goals can now be found in the care plan section) Acute Rehab PT Goals Patient Stated Goal: Regain IND PT Goal Formulation: With patient Time For Goal Achievement: 11/30/18 Potential to Achieve Goals: Good Progress towards PT goals: Progressing toward goals    Frequency    7X/week      PT Plan Current plan remains appropriate    Co-evaluation              AM-PAC PT "6 Clicks" Mobility   Outcome Measure  Help needed turning from your back to your side while in a flat bed without using bedrails?: A Little Help needed moving from lying on your back to sitting on the side of a  flat bed without using bedrails?: A Little Help needed moving to and from a bed to a chair (including a wheelchair)?: A Little Help needed standing up from a chair using your arms (e.g., wheelchair or bedside chair)?: A Little Help needed to walk in hospital room?: A Little Help needed climbing 3-5 steps with a railing? : A Little 6 Click Score: 18    End of Session Equipment Utilized During Treatment: Gait belt Activity Tolerance: Patient tolerated treatment well Patient left: in chair;with call bell/phone within reach;with family/visitor present Nurse Communication: Mobility status PT Visit Diagnosis: Difficulty in walking, not elsewhere classified (R26.2)     Time: 2706-2376 PT Time Calculation (min) (ACUTE ONLY): 35 min  Charges:  $Gait Training: 8-22  mins $Therapeutic Exercise: 8-22 mins                     [place] Pager [phone] Office [phone]    Kanyla Omeara 11/24/2018, 12:25 PM

## 2018-11-29 ENCOUNTER — Telehealth (INDEPENDENT_AMBULATORY_CARE_PROVIDER_SITE_OTHER): Payer: Self-pay | Admitting: Orthopaedic Surgery

## 2018-11-29 NOTE — Telephone Encounter (Signed)
IC and s/w Amy at [facility] [phone], the HHPT was authorized through [facility], Josem Kaufmann #[id number], Tracking [id number].  I had called [facility] earlier and s/w Susie, she stated the had closed this referral for [facility], OT, PT because they cannot staff it- they declined it.  No one called [facility] to advise this, so they had no idea until I called them.  [facility] has now resubmitted a request for [facility] for this patient as urgent/high priority, and they will call patient and me back to advise once this is staffed by another agency.  Intake #[id number] for [facility]. IC patient's wife and updated her and we will be in touch to make sure this gets scheduled.

## 2018-11-29 NOTE — Telephone Encounter (Signed)
Patient's wife called stating that they have not heard anything as far as home health PT.  CB#[phone].  Thank you.

## 2018-11-30 NOTE — Telephone Encounter (Signed)
IC Juan Powers and LM for her to call me to update.

## 2018-12-03 NOTE — Telephone Encounter (Signed)
IC wife and advised the below per Dr Ninfa Linden.

## 2018-12-03 NOTE — Telephone Encounter (Signed)
See my note below, please advise. Thanks.

## 2018-12-03 NOTE — Telephone Encounter (Signed)
I'll see how he is doing Thursday.

## 2018-12-03 NOTE — Telephone Encounter (Signed)
I have been talking to wife and this is another patient that did not get his HHPT postop.   Patient is doing well per wife and they actually had a family member who knew a PT and this therapist sent them some exercises to do.  IC wife again today, they still have not been contacted yet to schedule HHPT. HHPT was originally approved thru [employer] (they do [employer]'s auths for HHPT, etc.) to initiate at [facility], and [facility] declined stating they could not staff it, but [facility] never called and told anyone they declined.  Patient has a f/u Thurs with you for postop check.  Is it ok to continue the HEP?  Wife says he is doing well.  Or do you want to go ahead and order OP PT now?  Or wait til they see you Thurs?

## 2018-12-06 ENCOUNTER — Encounter (HOSPITAL_COMMUNITY): Payer: Self-pay | Admitting: Orthopaedic Surgery

## 2018-12-06 ENCOUNTER — Ambulatory Visit (INDEPENDENT_AMBULATORY_CARE_PROVIDER_SITE_OTHER): Payer: Managed Care, Other (non HMO) | Admitting: Physician Assistant

## 2018-12-06 DIAGNOSIS — Z96641 Presence of right artificial hip joint: Secondary | ICD-10-CM

## 2018-12-06 NOTE — Progress Notes (Signed)
HPI: Mr. Colegrove returns today 2 weeks status post right total hip arthroplasty.  He states he is overall feeling much better.  He is just having some muscle pain about the hip particularly his gluteus region.  He has had no chest pain shortness breath fevers chills.  Unfortunately physical therapy cannot start with him due to insurance complications.  He is ambulate with a cane.  He has been out to walk at the park feels like he is slowly improving.  Physical exam: General well-developed well-nourished male no acute distress mood and affect appropriate.  Right hip surgical incisions well approximated with subcutaneous Monocryl stitch.  There is no signs of wound dehiscence or infection.  Good range of motion right hip without pain.  Right calf supple nontender dorsiflexion plantarflexion ankle intact.  Impression: Status post right total hip arthroplasty  Plan: He  will work on range of motion of the hip.  Discussed with him that his main form of exercise needs to be reduced walking.  He will remain on 81 mg aspirin once daily for another week and then discontinue as he was on no aspirin prior to surgery.  New Steri-Strips were applied.  He is not to soak or get in a pool until the wound completely healed.  He will follow-up with [place] in 1 month sooner if there is any questions or concerns.  Questions were encouraged and answered at length today.

## 2018-12-07 ENCOUNTER — Telehealth (INDEPENDENT_AMBULATORY_CARE_PROVIDER_SITE_OTHER): Payer: Self-pay | Admitting: Orthopaedic Surgery

## 2018-12-07 NOTE — Telephone Encounter (Signed)
Juan Powers with [facility] called asked if a new order for HHPT can be faxed to [facility]. The fax# is [phone]  The ph# is [phone]  The number to contact Juan Powers is [phone]

## 2018-12-10 NOTE — Telephone Encounter (Signed)
IC and advised [facility] that this request needs to be cancelled, patient does not need HH at this point.

## 2018-12-12 ENCOUNTER — Other Ambulatory Visit (INDEPENDENT_AMBULATORY_CARE_PROVIDER_SITE_OTHER): Payer: Self-pay | Admitting: Orthopaedic Surgery

## 2018-12-12 ENCOUNTER — Telehealth (INDEPENDENT_AMBULATORY_CARE_PROVIDER_SITE_OTHER): Payer: Self-pay | Admitting: Orthopaedic Surgery

## 2018-12-12 MED ORDER — OXYCODONE HCL 5 MG PO TABS: 5.0000 mg | ORAL_TABLET | ORAL | 0 refills | Status: DC | PRN

## 2018-12-12 NOTE — Telephone Encounter (Signed)
I sent some in 

## 2018-12-12 NOTE — Telephone Encounter (Signed)
Rx refill Oxycodone 5mg  [facility] on [address]

## 2018-12-12 NOTE — Telephone Encounter (Signed)
Please advise 

## 2018-12-20 ENCOUNTER — Telehealth (INDEPENDENT_AMBULATORY_CARE_PROVIDER_SITE_OTHER): Payer: Self-pay | Admitting: Physician Assistant

## 2018-12-20 NOTE — Telephone Encounter (Signed)
Patient called to speak with you in regard to PT. You both had discussed this at one time but he really feels like he needed some pt for that hip.  He suggested [facility] as a facility his insurance will pay for.  Please call patient to advise. [phone]

## 2018-12-20 NOTE — Telephone Encounter (Signed)
I am fine with him having outpatient PT where ever this can be set up.  They can work on hip strengthening.

## 2018-12-20 NOTE — Telephone Encounter (Signed)
LMOM for patient letting him know to call me back and let me know whether he would like me to get the Rx to him

## 2018-12-21 ENCOUNTER — Telehealth (INDEPENDENT_AMBULATORY_CARE_PROVIDER_SITE_OTHER): Payer: Self-pay | Admitting: Orthopaedic Surgery

## 2018-12-21 NOTE — Telephone Encounter (Signed)
Out Patient Referral   Patient called he stated he spoke with someone in the office pertaining to an outpatient referral. Juan Powers stated he received a VM asking him how he would like to receive this information.  Patient advised me that he would like to pick up the information from the office on Monday. He also stated he would like to speak with the assistant he didn't specify wanted he wanted to discuss.

## 2018-12-25 NOTE — Telephone Encounter (Signed)
Called patient back and said to leave a message when he calls back if he still has them

## 2018-12-31 ENCOUNTER — Ambulatory Visit (INDEPENDENT_AMBULATORY_CARE_PROVIDER_SITE_OTHER): Payer: Managed Care, Other (non HMO) | Admitting: Physician Assistant

## 2019-01-07 ENCOUNTER — Encounter (INDEPENDENT_AMBULATORY_CARE_PROVIDER_SITE_OTHER): Payer: Self-pay | Admitting: Physician Assistant

## 2019-01-07 ENCOUNTER — Ambulatory Visit (INDEPENDENT_AMBULATORY_CARE_PROVIDER_SITE_OTHER): Payer: Managed Care, Other (non HMO) | Admitting: Physician Assistant

## 2019-01-07 DIAGNOSIS — Z96641 Presence of right artificial hip joint: Secondary | ICD-10-CM

## 2019-01-07 NOTE — Progress Notes (Signed)
HPI: Mr. Doig returns today for follow-up right total hip arthroplasty is now 45 days postop.  He is overall doing well.  He has had some low back pain with no radicular symptoms.  He states he is doing better since seeing a massage therapist and a physical therapist for his back.  He has no complaints in regards to his right hip.  He is wanting to return to work tomorrow full duties without any restrictions.  Physical exam: Right hip good range of motion without pain.  Walks without any assistive device and a nonantalgic gait.  Impression: Status post right total hip arthroplasty 11/23/2018  Plan: He will continue work on range of motion strengthening.  He will let us know if his back pain becomes worse or he develops any radicular symptoms.  Like to see him back in 3 months to see how well his right hip is doing.  Questions were encouraged and answered at length today.

## 2019-04-08 ENCOUNTER — Ambulatory Visit: Payer: Self-pay | Admitting: Orthopaedic Surgery

## 2019-04-22 ENCOUNTER — Encounter: Payer: Self-pay | Admitting: Orthopaedic Surgery

## 2019-04-22 ENCOUNTER — Other Ambulatory Visit: Payer: Self-pay

## 2019-04-22 ENCOUNTER — Ambulatory Visit (INDEPENDENT_AMBULATORY_CARE_PROVIDER_SITE_OTHER): Payer: Managed Care, Other (non HMO)

## 2019-04-22 ENCOUNTER — Ambulatory Visit (INDEPENDENT_AMBULATORY_CARE_PROVIDER_SITE_OTHER): Payer: Managed Care, Other (non HMO) | Admitting: Orthopaedic Surgery

## 2019-04-22 DIAGNOSIS — Z96641 Presence of right artificial hip joint: Secondary | ICD-10-CM | POA: Diagnosis not present

## 2019-04-22 NOTE — Progress Notes (Signed)
Office Visit Note   Patient: Juan Powers           Date of Birth: 06-18-1956           MRN: [id number] Visit Date: 04/22/2019              Requested by: No referring provider defined for this encounter. PCP: Patient, No Pcp Per   Assessment & Plan: Visit Diagnoses:  1. History of right hip replacement     Plan: Given the arthritis he does have a his left hip he would like to consider hip arthroplasty toward the end of the year on the left side.  He is very pleased with the success of his right hip.  Now that he is back exercising more and hiking he is now realizing that his left hip is bothering him.  He will still work on activity modification anti-inflammatories and hip strengthening exercises and we will see him back in 3 months from now to see how he is doing overall but no x-rays are needed unless there are issues.  Follow-Up Instructions: Return in about 3 months (around 07/23/2019).   Orders:  Orders Placed This Encounter  Procedures  . XR HIP UNILAT W OR W/O PELVIS 1V RIGHT   No orders of the defined types were placed in this encounter.     Procedures: No procedures performed   Clinical Data: No additional findings.   Subjective: Chief Complaint  Patient presents with  . Right Hip - Follow-up  The patient is a very pleasant 63 year old gentleman who is now between 5 and 6 months status post a right total hip arthroplasty.  He says that right hip is doing well and he has no issues with it at all.  He has been having just a little bit of left hip discomfort and able to make sure that everything is doing okay with his left hip.  He denies any groin pain on the right side at this point a little bit of global pain around the left hip.  He denies any stiffness in the left side or the right operative side.  He has had no recent illnesses either.  He is a very active 63 year old gentleman  HPI  Review of Systems He currently denies any headache, chest pain, shortness  of breath, fever, chills, nausea, vomiting  Objective: Vital Signs: There were no vitals taken for this visit.  Physical Exam He is alert and oriented x3 and in no acute distress Ortho Exam Examination of his right operative hip shows that he moves fluidly with no pain at all and no blocks to rotation.  There is no discomfort.  Examination of his left hip shows that he moves fluidly with minimal discomfort.  The rotation is full on the left hip. Specialty Comments:  No specialty comments available.  Imaging: Xr Hip Unilat W Or W/o Pelvis 1v Right  Result Date: 04/22/2019 An AP pelvis and lateral the right hip shows a well-seated total hip arthroplasty of the right side with no complicating features.  The left hip does have significant osteoarthritis with superior lateral joint space narrowing, flattening of the superior lateral femoral head as well as para-articular osteophytes    PMFS History: Patient Active Problem List   Diagnosis Date Noted  . Unilateral primary osteoarthritis, right hip 11/23/2018  . 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 Anemia    LATE TEENS EARLY 20S   .  Arthritis    RIGHT HIP   . Basal cell carcinoma    MULTIPLE OVER THE YEARS  . Hepatitis    HEP A IN THE EARLY 80s , TREATED AND RESOLVED   . Tinnitus     Family History  Problem Relation Age of Onset  . Cancer Father   . Heart disease Father   . Cancer Sister     Past Surgical History:  Procedure Laterality Date  . COLONOSCOPY    . TONSILLECTOMY     CHILDHOOD   . TOTAL HIP ARTHROPLASTY Right 11/23/2018   Procedure: RIGHT TOTAL HIP ARTHROPLASTY ANTERIOR APPROACH;  Surgeon: Mcarthur Rossetti, MD;  Location: [facility];  Service: Orthopedics;  Laterality: Right;   Social History   Occupational History  . Not on file  Tobacco Use  . Smoking status: Former Smoker    Packs/day: 1.00    Years: 20.00    Pack years: 20.00    Types:  Cigarettes    Last attempt to quit: 2000    Years since quitting: 20.3  . Smokeless tobacco: Never Used  Substance and Sexual Activity  . Alcohol use: Yes    Alcohol/week: 0.0 standard drinks    Comment: OCC  . Drug use: Yes    Types: Marijuana    Comment: YEARS   . Sexual activity: Not on file

## 2019-07-25 IMAGING — RF DG HIP (WITH PELVIS) OPERATIVE*R*
1 series · 2 of 2 positions shown · non-contrast
Comparison: 06/11/2018

CLINICAL DATA: Right hip replacement.

EXAM:
OPERATIVE RIGHT HIP (WITH PELVIS IF PERFORMED) 2 VIEWS
TECHNIQUE: Fluoroscopic spot image(s) were submitted for interpretation
post-operatively.

[Series 1: run · 2 of 2 slices shown]
[im 1/2]
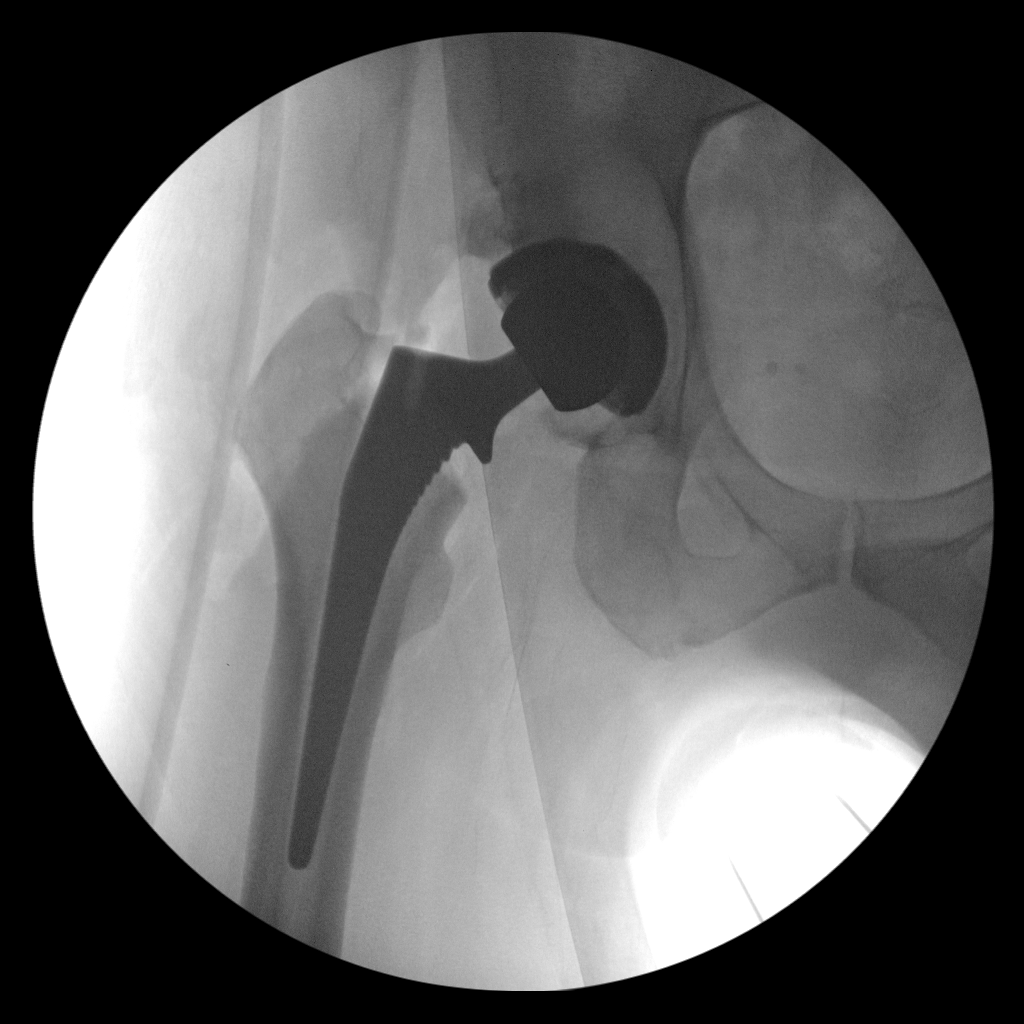
[im 2/2]
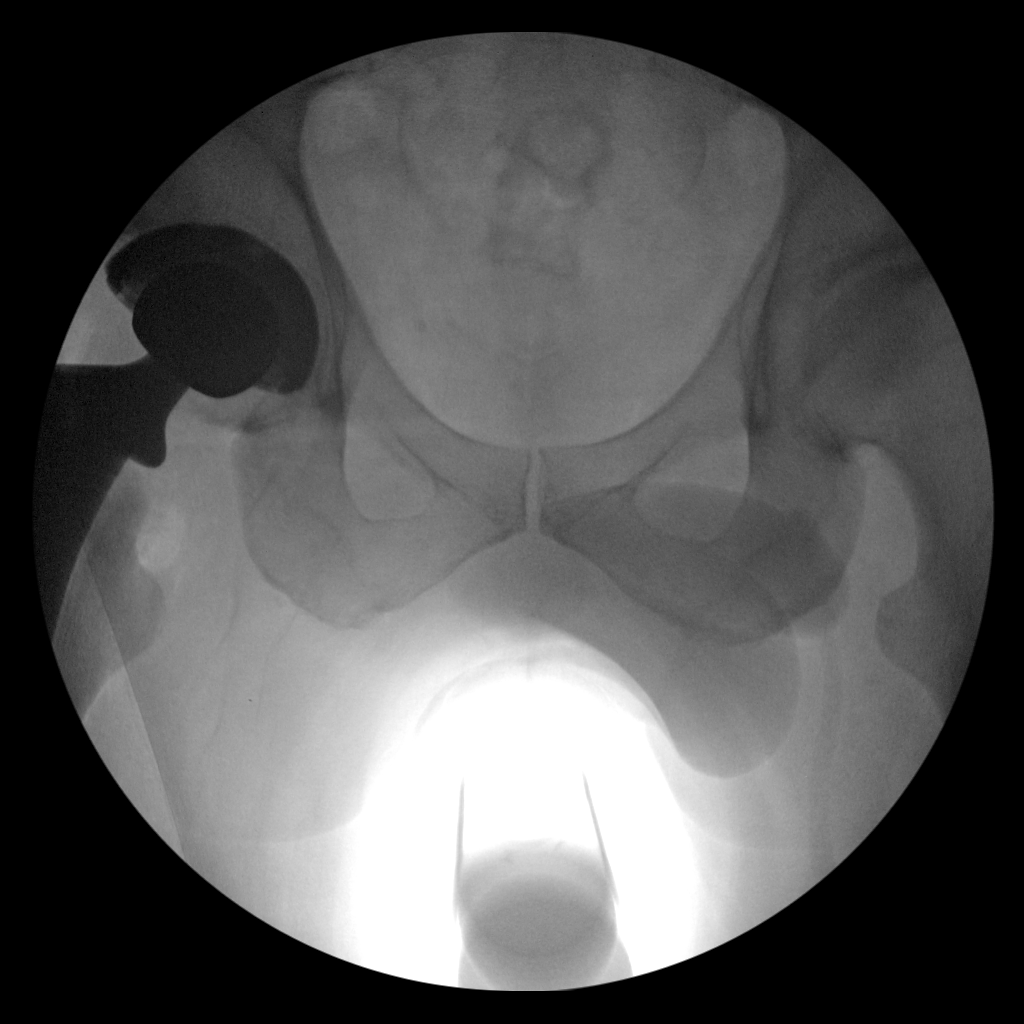

[2 of 2 positions shown; findings below may reference images not displayed]

FINDINGS: 2 intraoperative fluoroscopic images demonstrate interval
performance of a right total hip arthroplasty. The prosthetic
components are normally located on these limited images, and no
acute fracture is identified.
IMPRESSION: Intraoperative images during right total hip arthroplasty.

## 2019-07-26 ENCOUNTER — Other Ambulatory Visit: Payer: Self-pay

## 2019-07-26 ENCOUNTER — Encounter (INDEPENDENT_AMBULATORY_CARE_PROVIDER_SITE_OTHER): Payer: Self-pay

## 2019-07-26 DIAGNOSIS — Z20822 Contact with and (suspected) exposure to covid-19: Secondary | ICD-10-CM

## 2019-07-27 LAB — NOVEL CORONAVIRUS, NAA: SARS-CoV-2, NAA: NOT DETECTED

## 2019-08-05 ENCOUNTER — Ambulatory Visit: Payer: Managed Care, Other (non HMO) | Admitting: Orthopaedic Surgery

## 2019-09-02 ENCOUNTER — Ambulatory Visit (INDEPENDENT_AMBULATORY_CARE_PROVIDER_SITE_OTHER): Payer: Managed Care, Other (non HMO)

## 2019-09-02 ENCOUNTER — Ambulatory Visit (INDEPENDENT_AMBULATORY_CARE_PROVIDER_SITE_OTHER): Payer: Managed Care, Other (non HMO) | Admitting: Orthopaedic Surgery

## 2019-09-02 ENCOUNTER — Encounter: Payer: Self-pay | Admitting: Orthopaedic Surgery

## 2019-09-02 DIAGNOSIS — M25552 Pain in left hip: Secondary | ICD-10-CM | POA: Diagnosis not present

## 2019-09-02 DIAGNOSIS — M1612 Unilateral primary osteoarthritis, left hip: Secondary | ICD-10-CM | POA: Diagnosis not present

## 2019-09-02 NOTE — Progress Notes (Signed)
Office Visit Note   Patient: Juan Powers           Date of Birth: [birth year]           MRN: [id number] Visit Date: 09/02/2019              Requested by: No referring provider defined for this encounter. PCP: Patient, No Pcp Per   Assessment & Plan: Visit Diagnoses:  1. Pain in left hip   2. Unilateral primary osteoarthritis, left hip     Plan: At this point he is tried and failed conservative treatment for over a year for his left hip and has well-documented end-stage arthritis of the left hip.  He is done well with a right total hip arthroplasty.  He would like to have this done on his left hip.  Having had this done before he is fully aware of the risk and benefits of the surgery and what his interoperative and postoperative course involved.  All question concerns were answered addressed.  We will work on getting this scheduled.  Follow-Up Instructions: Return for 2 weeks post-op.   Orders:  Orders Placed This Encounter  Procedures  . XR HIP UNILAT W OR W/O PELVIS 1V LEFT   No orders of the defined types were placed in this encounter.     Procedures: No procedures performed   Clinical Data: No additional findings.   Subjective: Chief Complaint  Patient presents with  . Left Hip - Pain  The patient is well-known to [place].  He is actually 9 months status post a right total hip arthroplasty to treat the pain from severe degenerative arthritis of the right hip.  He has known left hip arthritis.  This is been well-documented and he is sought conservative treatment for well over a year for his left hip arthritis.  This includes activity modification, anti-inflammatories, hip strengthening exercises with therapy and injections.  His right operative hip is done very well.  His left hip is very stiff and is painful in the groin.  At this point his left hip pain is detriment affecting his mobility, his quality of life and his activities daily living to the point he does wish  proceed with a total hip arthroplasty on the left side.  HPI  Review of Systems He currently denies any headache, chest pain, shortness of breath, fever, chills, nausea, vomiting  Objective: Vital Signs: There were no vitals taken for this visit.  Physical Exam He is alert and orient x3 and in no acute distress Ortho Exam Examination of his right operative hip shows it moves smoothly and fully and no pain at all.  Examination of left hip shows significant stiffness with internal and external rotation.  There is a lot of pain in the groin with rotation of the left hip. Specialty Comments:  No specialty comments available.  Imaging: Xr Hip Unilat W Or W/o Pelvis 1v Left  Result Date: 09/02/2019 An AP pelvis and lateral of the left hip shows a well-seated total hip arthroplasty on the right side with no complicating features.  The left hip does show severe osteoarthritis in degenerative joint disease.  There is flattening of the femoral head.  There is joint space narrowing and large para-articular osteophytes.    PMFS History: Patient Active Problem List   Diagnosis Date Noted  . Unilateral primary osteoarthritis, left hip 09/02/2019  . Unilateral primary osteoarthritis, right hip 11/23/2018  . Status post total replacement of right hip 11/23/2018  Past Medical History:  Diagnosis Date  . Anemia    LATE TEENS EARLY 20S   . Arthritis    RIGHT HIP   . Basal cell carcinoma    MULTIPLE OVER THE YEARS  . Hepatitis    HEP A IN THE EARLY 80s , TREATED AND RESOLVED   . Tinnitus     Family History  Problem Relation Age of Onset  . Cancer Father   . Heart disease Father   . Cancer Sister     Past Surgical History:  Procedure Laterality Date  . COLONOSCOPY    . TONSILLECTOMY     CHILDHOOD   . TOTAL HIP ARTHROPLASTY Right 11/23/2018   Procedure: RIGHT TOTAL HIP ARTHROPLASTY ANTERIOR APPROACH;  Surgeon: Mcarthur Rossetti, MD;  Location: [facility];  Service: Orthopedics;   Laterality: Right;   Social History   Occupational History  . Not on file  Tobacco Use  . Smoking status: Former Smoker    Packs/day: 1.00    Years: 20.00    Pack years: 20.00    Types: Cigarettes    Quit date: 2000    Years since quitting: 20.7  . Smokeless tobacco: Never Used  Substance and Sexual Activity  . Alcohol use: Yes    Alcohol/week: 0.0 standard drinks    Comment: OCC  . Drug use: Yes    Types: Marijuana    Comment: YEARS   . Sexual activity: Not on file

## 2019-09-10 ENCOUNTER — Other Ambulatory Visit: Payer: Self-pay

## 2019-09-10 DIAGNOSIS — Z20822 Contact with and (suspected) exposure to covid-19: Secondary | ICD-10-CM

## 2019-09-13 ENCOUNTER — Telehealth: Payer: Self-pay | Admitting: General Practice

## 2019-09-13 LAB — NOVEL CORONAVIRUS, NAA: SARS-CoV-2, NAA: NOT DETECTED

## 2019-09-13 NOTE — Telephone Encounter (Signed)
Negative COVID results given. Patient results "NOT Detected." Caller expressed understanding. ° °

## 2019-10-03 ENCOUNTER — Other Ambulatory Visit: Payer: Self-pay

## 2019-10-10 ENCOUNTER — Other Ambulatory Visit: Payer: Self-pay | Admitting: Physician Assistant

## 2019-10-14 NOTE — Patient Instructions (Signed)
DUE TO COVID-19 ONLY ONE VISITOR IS ALLOWED TO COME WITH YOU AND STAY IN THE WAITING ROOM ONLY DURING PRE OP AND PROCEDURE. THE ONE VISITOR MAY VISIT WITH YOU IN YOUR PRIVATE ROOM DURING VISITING HOURS ONLY!!   COVID SWAB TESTING MUST BE COMPLETED ON:  Today, Immediately after pre op sppointment    [address], [place] [facility] (Must self quarantine after testing. Follow instructions on handout.)             Your procedure is scheduled on: Friday, Nov. 13, 2020   Report to [facility] Main  Entrance   Report to Short Stay at 5:30 AM   Call this number if you have problems the morning of surgery [phone]   Do not eat food :After Midnight.   May have liquids until 4:15 AM day of surgery   CLEAR LIQUID DIET  Foods Allowed                                                                     Foods Excluded  Water, Black Coffee and tea, regular and decaf                             liquids that you cannot  Plain Jell-O in any flavor  (No red)                                           see through such as: Fruit ices (not with fruit pulp)                                     milk, soups, orange juice  Iced Popsicles (No red)                                    All solid food Carbonated beverages, regular and diet                                    Apple juices Sports drinks like Gatorade (No red) Lightly seasoned clear broth or consume(fat free) Sugar, honey syrup  Sample Menu Breakfast                                Lunch                                     Supper Cranberry juice                    Beef broth                            Chicken broth  Jell-O                                     Grape juice                           Apple juice Coffee or tea                        Jell-O                                      Popsicle                                                Coffee or tea                         Coffee or tea   Complete one Ensure drink the morning of surgery at 4:15 AM the day of surgery.   Brush your teeth the morning of surgery.   Do NOT smoke after Midnight   Take these medicines the morning of surgery with A SIP OF WATER: Cetirizine                               You may not have any metal on your body including jewelry, and body piercings             Do not wear lotions, powders, perfumes/cologne, or deodorant                         Men may shave face and neck.   Do not bring valuables to the hospital. [place].   Contacts, dentures or bridgework may not be worn into surgery.   Bring small overnight bag day of surgery.    Special Instructions: Bring a copy of your healthcare power of attorney and living will documents         the day of surgery if you haven't scanned them in before.              Please read over the following fact sheets you were given:  [facility] - Preparing for Surgery Before surgery, you can play an important role.  Because skin is not sterile, your skin needs to be as free of germs as possible.  You can reduce the number of germs on your skin by washing with CHG (chlorahexidine gluconate) soap before surgery.  CHG is an antiseptic cleaner which kills germs and bonds with the skin to continue killing germs even after washing. Please DO NOT use if you have an allergy to CHG or antibacterial soaps.  If your skin becomes reddened/irritated stop using the CHG and inform your nurse when you arrive at Short Stay. Do not shave (including legs and underarms) for at least 48 hours prior to the first CHG shower.  You may shave your face/neck.  Please follow these instructions carefully:  1.  Shower with CHG Soap the night before surgery and the  morning of surgery.  2.  If you choose to wash your hair, wash your hair first as usual with your normal  shampoo.  3.  After you shampoo, rinse your hair and body  thoroughly to remove the shampoo.                             4.  Use CHG as you would any other liquid soap.  You can apply chg directly to the skin and wash.  Gently with a scrungie or clean washcloth.  5.  Apply the CHG Soap to your body ONLY FROM THE NECK DOWN.   Do   not use on face/ open                           Wound or open sores. Avoid contact with eyes, ears mouth and   genitals (private parts).                       Wash face,  Genitals (private parts) with your normal soap.             6.  Wash thoroughly, paying special attention to the area where your    surgery  will be performed.  7.  Thoroughly rinse your body with warm water from the neck down.  8.  DO NOT shower/wash with your normal soap after using and rinsing off the CHG Soap.                9.  Pat yourself dry with a clean towel.            10.  Wear clean pajamas.            11.  Place clean sheets on your bed the night of your first shower and do not  sleep with pets. Day of Surgery : Do not apply any lotions/deodorants the morning of surgery.  Please wear clean clothes to the hospital/surgery center.  FAILURE TO FOLLOW THESE INSTRUCTIONS MAY RESULT IN THE CANCELLATION OF YOUR SURGERY  PATIENT SIGNATURE_________________________________  NURSE SIGNATURE__________________________________  ________________________________________________________________________   Juan Powers  An incentive spirometer is a tool that can help keep your lungs clear and active. This tool measures how well you are filling your lungs with each breath. Taking long deep breaths may help reverse or decrease the chance of developing breathing (pulmonary) problems (especially infection) following:  A long period of time when you are unable to move or be active. BEFORE THE PROCEDURE   If the spirometer includes an indicator to show your best effort, your nurse or respiratory therapist will set it to a desired goal.  If possible,  sit up straight or lean slightly forward. Try not to slouch.  Hold the incentive spirometer in an upright position. INSTRUCTIONS FOR USE  1. Sit on the edge of your bed if possible, or sit up as far as you can in bed or on a chair. 2. Hold the incentive spirometer in an upright position. 3. Breathe out normally. 4. Place the mouthpiece in your mouth and seal your lips tightly around it. 5. Breathe in slowly and as deeply as possible, raising the piston or the ball toward the top of the column. 6. Hold your breath for 3-5 seconds or for as long as possible. Allow  the piston or ball to fall to the bottom of the column. 7. Remove the mouthpiece from your mouth and breathe out normally. 8. Rest for a few seconds and repeat Steps 1 through 7 at least 10 times every 1-2 hours when you are awake. Take your time and take a few normal breaths between deep breaths. 9. The spirometer may include an indicator to show your best effort. Use the indicator as a goal to work toward during each repetition. 10. After each set of 10 deep breaths, practice coughing to be sure your lungs are clear. If you have an incision (the cut made at the time of surgery), support your incision when coughing by placing a pillow or rolled up towels firmly against it. Once you are able to get out of bed, walk around indoors and cough well. You may stop using the incentive spirometer when instructed by your caregiver.  RISKS AND COMPLICATIONS  Take your time so you do not get dizzy or light-headed.  If you are in pain, you may need to take or ask for pain medication before doing incentive spirometry. It is harder to take a deep breath if you are having pain. AFTER USE  Rest and breathe slowly and easily.  It can be helpful to keep track of a log of your progress. Your caregiver can provide you with a simple table to help with this. If you are using the spirometer at home, follow these instructions: [place] IF:   You  are having difficultly using the spirometer.  You have trouble using the spirometer as often as instructed.  Your pain medication is not giving enough relief while using the spirometer.  You develop fever of 100.5 F (38.1 C) or higher. SEEK IMMEDIATE MEDICAL CARE IF:   You cough up bloody sputum that had not been present before.  You develop fever of 102 F (38.9 C) or greater.  You develop worsening pain at or near the incision site. MAKE SURE YOU:   Understand these instructions.  Will watch your condition.  Will get help right away if you are not doing well or get worse. Document Released: 04/03/2007 Document Revised: 02/13/2012 Document Reviewed: 06/04/2007 [facility] Patient Information 2014 [place], [place].   ________________________________________________________________________

## 2019-10-15 ENCOUNTER — Encounter (HOSPITAL_COMMUNITY): Payer: Self-pay

## 2019-10-15 ENCOUNTER — Other Ambulatory Visit (HOSPITAL_COMMUNITY)
Admission: RE | Admit: 2019-10-15 | Discharge: 2019-10-15 | Disposition: A | Discharge status: Home / Self Care | Payer: Managed Care, Other (non HMO) | Source: Ambulatory Visit | Attending: Orthopaedic Surgery | Admitting: Orthopaedic Surgery

## 2019-10-15 ENCOUNTER — Encounter (HOSPITAL_COMMUNITY)
Admission: RE | Admit: 2019-10-15 | Discharge: 2019-10-15 | Disposition: A | Discharge status: Home / Self Care | Payer: Managed Care, Other (non HMO) | Source: Ambulatory Visit | Attending: Orthopaedic Surgery | Admitting: Orthopaedic Surgery

## 2019-10-15 ENCOUNTER — Other Ambulatory Visit: Payer: Self-pay

## 2019-10-15 ENCOUNTER — Other Ambulatory Visit (HOSPITAL_COMMUNITY): Payer: Managed Care, Other (non HMO)

## 2019-10-15 DIAGNOSIS — M1612 Unilateral primary osteoarthritis, left hip: Secondary | ICD-10-CM | POA: Insufficient documentation

## 2019-10-15 DIAGNOSIS — Z01812 Encounter for preprocedural laboratory examination: Secondary | ICD-10-CM | POA: Insufficient documentation

## 2019-10-15 DIAGNOSIS — Z20828 Contact with and (suspected) exposure to other viral communicable diseases: Secondary | ICD-10-CM | POA: Insufficient documentation

## 2019-10-15 LAB — CBC
HCT: 44.8 % (ref 39.0–52.0)
Hemoglobin: 14.4 g/dL (ref 13.0–17.0)
MCH: 30.4 pg (ref 26.0–34.0)
MCHC: 32.1 g/dL (ref 30.0–36.0)
MCV: 94.7 fL (ref 80.0–100.0)
Platelets: 272 10*3/uL (ref 150–400)
RBC: 4.73 MIL/uL (ref 4.22–5.81)
RDW: 12.1 % (ref 11.5–15.5)
WBC: 7.3 10*3/uL (ref 4.0–10.5)
nRBC: 0 % (ref 0.0–0.2)

## 2019-10-15 LAB — SURGICAL PCR SCREEN
MRSA, PCR: NEGATIVE
Staphylococcus aureus: NEGATIVE

## 2019-10-15 MED ORDER — CHLORHEXIDINE GLUCONATE 4 % EX LIQD: 60.0000 mL | Freq: Once | CUTANEOUS | Status: DC

## 2019-10-15 NOTE — Progress Notes (Signed)
PCP - None Cardiologist -   Chest x-ray -  EKG -  Stress Test -  ECHO -  Cardiac Cath -   Sleep Study -  CPAP -   Fasting Blood Sugar -  Checks Blood Sugar _____ times a day  Blood Thinner Instructions: Aspirin Instructions:Pt. Is not taking aspirin Last Dose:  Anesthesia review:   Patient denies shortness of breath, fever, cough and chest pain at PAT appointment   Patient verbalized understanding of instructions that were given to them at the PAT appointment. Patient was also instructed that they will need to review over the PAT instructions again at home before surgery.

## 2019-10-17 LAB — NOVEL CORONAVIRUS, NAA (HOSP ORDER, SEND-OUT TO REF LAB; TAT 18-24 HRS): SARS-CoV-2, NAA: NOT DETECTED

## 2019-10-18 ENCOUNTER — Inpatient Hospital Stay (HOSPITAL_COMMUNITY): Payer: Managed Care, Other (non HMO) | Admitting: Physician Assistant

## 2019-10-18 ENCOUNTER — Other Ambulatory Visit: Payer: Self-pay

## 2019-10-18 ENCOUNTER — Inpatient Hospital Stay (HOSPITAL_COMMUNITY): Payer: Managed Care, Other (non HMO)

## 2019-10-18 ENCOUNTER — Encounter (HOSPITAL_COMMUNITY): Payer: Self-pay | Admitting: *Deleted

## 2019-10-18 ENCOUNTER — Encounter (HOSPITAL_COMMUNITY)
Admission: RE | Disposition: A | Discharge status: Home Health | Payer: Self-pay | Source: Other Acute Inpatient Hospital | Attending: Orthopaedic Surgery

## 2019-10-18 ENCOUNTER — Inpatient Hospital Stay (HOSPITAL_COMMUNITY)
Admission: RE | Admit: 2019-10-18 | Discharge: 2019-10-19 | DRG: 470 | Disposition: A | Discharge status: Home Health | Payer: Managed Care, Other (non HMO) | Source: Other Acute Inpatient Hospital | Attending: Orthopaedic Surgery | Admitting: Orthopaedic Surgery

## 2019-10-18 ENCOUNTER — Inpatient Hospital Stay (HOSPITAL_COMMUNITY): Payer: Managed Care, Other (non HMO) | Admitting: Anesthesiology

## 2019-10-18 DIAGNOSIS — Z96641 Presence of right artificial hip joint: Secondary | ICD-10-CM | POA: Diagnosis present

## 2019-10-18 DIAGNOSIS — Z8249 Family history of ischemic heart disease and other diseases of the circulatory system: Secondary | ICD-10-CM | POA: Diagnosis not present

## 2019-10-18 DIAGNOSIS — M25752 Osteophyte, left hip: Secondary | ICD-10-CM | POA: Diagnosis present

## 2019-10-18 DIAGNOSIS — Z888 Allergy status to other drugs, medicaments and biological substances status: Secondary | ICD-10-CM | POA: Diagnosis not present

## 2019-10-18 DIAGNOSIS — Z96642 Presence of left artificial hip joint: Secondary | ICD-10-CM

## 2019-10-18 DIAGNOSIS — M1612 Unilateral primary osteoarthritis, left hip: Principal | ICD-10-CM

## 2019-10-18 DIAGNOSIS — Z85828 Personal history of other malignant neoplasm of skin: Secondary | ICD-10-CM

## 2019-10-18 DIAGNOSIS — Z87891 Personal history of nicotine dependence: Secondary | ICD-10-CM | POA: Diagnosis not present

## 2019-10-18 DIAGNOSIS — Z20828 Contact with and (suspected) exposure to other viral communicable diseases: Secondary | ICD-10-CM | POA: Diagnosis present

## 2019-10-18 DIAGNOSIS — Z419 Encounter for procedure for purposes other than remedying health state, unspecified: Secondary | ICD-10-CM

## 2019-10-18 HISTORY — PX: TOTAL HIP ARTHROPLASTY: SHX124

## 2019-10-18 SURGERY — ARTHROPLASTY, HIP, TOTAL, ANTERIOR APPROACH
Anesthesia: Spinal | Site: Hip | Laterality: Left

## 2019-10-18 MED ORDER — LIDOCAINE 2% (20 MG/ML) 5 ML SYRINGE
INTRAMUSCULAR | Status: AC
  Filled 2019-10-18: qty 5

## 2019-10-18 MED ORDER — PROPOFOL 500 MG/50ML IV EMUL
INTRAVENOUS | Status: AC
  Filled 2019-10-18: qty 50

## 2019-10-18 MED ORDER — DOCUSATE SODIUM 100 MG PO CAPS
100.0000 mg | ORAL_CAPSULE | Freq: Two times a day (BID) | ORAL | Status: DC
  Administered 2019-10-18 – 2019-10-19 (×2): 100 mg via ORAL
  Filled 2019-10-18 (×2): qty 1

## 2019-10-18 MED ORDER — BUPIVACAINE IN DEXTROSE 0.75-8.25 % IT SOLN
INTRATHECAL | Status: DC | PRN
  Administered 2019-10-18: 1.6 mL via INTRATHECAL

## 2019-10-18 MED ORDER — PROMETHAZINE HCL 25 MG/ML IJ SOLN: 6.2500 mg | INTRAMUSCULAR | Status: DC | PRN

## 2019-10-18 MED ORDER — TAMSULOSIN HCL 0.4 MG PO CAPS
0.4000 mg | ORAL_CAPSULE | Freq: Every day | ORAL | Status: DC
  Administered 2019-10-18: 0.4 mg via ORAL
  Filled 2019-10-18 (×2): qty 1

## 2019-10-18 MED ORDER — ONDANSETRON HCL 4 MG PO TABS: 4.0000 mg | ORAL_TABLET | Freq: Four times a day (QID) | ORAL | Status: DC | PRN

## 2019-10-18 MED ORDER — CEFAZOLIN SODIUM-DEXTROSE 2-4 GM/100ML-% IV SOLN
2.0000 g | INTRAVENOUS | Status: AC
  Administered 2019-10-18: 2 g via INTRAVENOUS
  Filled 2019-10-18: qty 100

## 2019-10-18 MED ORDER — LACTATED RINGERS IV SOLN
INTRAVENOUS | Status: DC
  Administered 2019-10-18 (×2): via INTRAVENOUS

## 2019-10-18 MED ORDER — SODIUM CHLORIDE 0.9 % IV SOLN
INTRAVENOUS | Status: DC
  Administered 2019-10-18 (×2): via INTRAVENOUS

## 2019-10-18 MED ORDER — ASPIRIN 81 MG PO CHEW
81.0000 mg | CHEWABLE_TABLET | Freq: Two times a day (BID) | ORAL | Status: DC
  Administered 2019-10-18 – 2019-10-19 (×2): 81 mg via ORAL
  Filled 2019-10-18 (×3): qty 1

## 2019-10-18 MED ORDER — LACTATED RINGERS IV SOLN: INTRAVENOUS | Status: DC

## 2019-10-18 MED ORDER — METOCLOPRAMIDE HCL 5 MG PO TABS: 5.0000 mg | ORAL_TABLET | Freq: Three times a day (TID) | ORAL | Status: DC | PRN

## 2019-10-18 MED ORDER — ACETAMINOPHEN 325 MG PO TABS: 325.0000 mg | ORAL_TABLET | Freq: Four times a day (QID) | ORAL | Status: DC | PRN

## 2019-10-18 MED ORDER — PHENOL 1.4 % MT LIQD: 1.0000 | OROMUCOSAL | Status: DC | PRN

## 2019-10-18 MED ORDER — HYDROMORPHONE HCL 1 MG/ML IJ SOLN
INTRAMUSCULAR | Status: AC
  Filled 2019-10-18: qty 1

## 2019-10-18 MED ORDER — ACETAMINOPHEN 325 MG PO TABS: 325.0000 mg | ORAL_TABLET | Freq: Once | ORAL | Status: DC | PRN

## 2019-10-18 MED ORDER — OXYCODONE HCL 5 MG PO TABS
10.0000 mg | ORAL_TABLET | ORAL | Status: DC | PRN
  Administered 2019-10-19 (×2): 10 mg via ORAL
  Filled 2019-10-18: qty 3

## 2019-10-18 MED ORDER — PANTOPRAZOLE SODIUM 40 MG PO TBEC
40.0000 mg | DELAYED_RELEASE_TABLET | Freq: Every day | ORAL | Status: DC
  Administered 2019-10-19: 40 mg via ORAL
  Filled 2019-10-18: qty 1

## 2019-10-18 MED ORDER — SODIUM CHLORIDE 0.9 % IR SOLN
Status: DC | PRN
  Administered 2019-10-18: 1000 mL

## 2019-10-18 MED ORDER — 0.9 % SODIUM CHLORIDE (POUR BTL) OPTIME
TOPICAL | Status: DC | PRN
  Administered 2019-10-18: 1000 mL

## 2019-10-18 MED ORDER — MENTHOL 3 MG MT LOZG: 1.0000 | LOZENGE | OROMUCOSAL | Status: DC | PRN

## 2019-10-18 MED ORDER — METHOCARBAMOL 500 MG PO TABS
500.0000 mg | ORAL_TABLET | Freq: Four times a day (QID) | ORAL | Status: DC | PRN
  Administered 2019-10-18: 500 mg via ORAL
  Filled 2019-10-18 (×2): qty 1

## 2019-10-18 MED ORDER — POVIDONE-IODINE 10 % EX SWAB
2.0000 "application " | Freq: Once | CUTANEOUS | Status: AC
  Administered 2019-10-18: 2 via TOPICAL

## 2019-10-18 MED ORDER — PHENYLEPHRINE 40 MCG/ML (10ML) SYRINGE FOR IV PUSH (FOR BLOOD PRESSURE SUPPORT)
PREFILLED_SYRINGE | INTRAVENOUS | Status: AC
  Filled 2019-10-18: qty 10

## 2019-10-18 MED ORDER — METHOCARBAMOL 500 MG IVPB - SIMPLE MED
INTRAVENOUS | Status: AC
  Filled 2019-10-18: qty 50

## 2019-10-18 MED ORDER — ALUM & MAG HYDROXIDE-SIMETH 200-200-20 MG/5ML PO SUSP: 30.0000 mL | ORAL | Status: DC | PRN

## 2019-10-18 MED ORDER — STERILE WATER FOR IRRIGATION IR SOLN
Status: DC | PRN
  Administered 2019-10-18: 2000 mL

## 2019-10-18 MED ORDER — MEPERIDINE HCL 50 MG/ML IJ SOLN: 6.2500 mg | INTRAMUSCULAR | Status: DC | PRN

## 2019-10-18 MED ORDER — TRANEXAMIC ACID-NACL 1000-0.7 MG/100ML-% IV SOLN
1000.0000 mg | INTRAVENOUS | Status: AC
  Administered 2019-10-18: 1000 mg via INTRAVENOUS
  Filled 2019-10-18: qty 100

## 2019-10-18 MED ORDER — PROPOFOL 500 MG/50ML IV EMUL
INTRAVENOUS | Status: DC | PRN
  Administered 2019-10-18: 100 ug/kg/min via INTRAVENOUS

## 2019-10-18 MED ORDER — ONDANSETRON HCL 4 MG/2ML IJ SOLN: 4.0000 mg | Freq: Four times a day (QID) | INTRAMUSCULAR | Status: DC | PRN

## 2019-10-18 MED ORDER — KETOROLAC TROMETHAMINE 15 MG/ML IJ SOLN
7.5000 mg | Freq: Four times a day (QID) | INTRAMUSCULAR | Status: AC
  Administered 2019-10-18 – 2019-10-19 (×3): 7.5 mg via INTRAVENOUS
  Filled 2019-10-18 (×3): qty 1

## 2019-10-18 MED ORDER — HYDROMORPHONE HCL 1 MG/ML IJ SOLN
INTRAMUSCULAR | Status: AC
  Filled 2019-10-18: qty 2

## 2019-10-18 MED ORDER — EPHEDRINE 5 MG/ML INJ
INTRAVENOUS | Status: AC
  Filled 2019-10-18: qty 10

## 2019-10-18 MED ORDER — HYDROMORPHONE HCL 1 MG/ML IJ SOLN: 0.5000 mg | INTRAMUSCULAR | Status: DC | PRN

## 2019-10-18 MED ORDER — ACETAMINOPHEN 10 MG/ML IV SOLN
INTRAVENOUS | Status: AC
  Filled 2019-10-18: qty 100

## 2019-10-18 MED ORDER — MIDAZOLAM HCL 2 MG/2ML IJ SOLN
INTRAMUSCULAR | Status: DC | PRN
  Administered 2019-10-18: 2 mg via INTRAVENOUS

## 2019-10-18 MED ORDER — EPHEDRINE SULFATE 50 MG/ML IJ SOLN
INTRAMUSCULAR | Status: DC | PRN
  Administered 2019-10-18: 5 mg via INTRAVENOUS
  Administered 2019-10-18: 10 mg via INTRAVENOUS
  Administered 2019-10-18: 5 mg via INTRAVENOUS

## 2019-10-18 MED ORDER — ZOLPIDEM TARTRATE 5 MG PO TABS: 5.0000 mg | ORAL_TABLET | Freq: Every evening | ORAL | Status: DC | PRN

## 2019-10-18 MED ORDER — METHOCARBAMOL 500 MG IVPB - SIMPLE MED
500.0000 mg | Freq: Four times a day (QID) | INTRAVENOUS | Status: DC | PRN
  Administered 2019-10-18: 500 mg via INTRAVENOUS
  Filled 2019-10-18: qty 50

## 2019-10-18 MED ORDER — ACETAMINOPHEN 10 MG/ML IV SOLN
1000.0000 mg | Freq: Once | INTRAVENOUS | Status: DC | PRN
  Administered 2019-10-18: 1000 mg via INTRAVENOUS

## 2019-10-18 MED ORDER — CEFAZOLIN SODIUM-DEXTROSE 2-3 GM-%(50ML) IV SOLR
INTRAVENOUS | Status: DC | PRN
  Administered 2019-10-18: 2 g via INTRAVENOUS

## 2019-10-18 MED ORDER — METOCLOPRAMIDE HCL 5 MG/ML IJ SOLN: 5.0000 mg | Freq: Three times a day (TID) | INTRAMUSCULAR | Status: DC | PRN

## 2019-10-18 MED ORDER — CEFAZOLIN SODIUM-DEXTROSE 1-4 GM/50ML-% IV SOLN
1.0000 g | Freq: Four times a day (QID) | INTRAVENOUS | Status: AC
  Administered 2019-10-18 (×2): 1 g via INTRAVENOUS
  Filled 2019-10-18 (×2): qty 50

## 2019-10-18 MED ORDER — HYDROMORPHONE HCL 1 MG/ML IJ SOLN
0.2500 mg | INTRAMUSCULAR | Status: DC | PRN
  Administered 2019-10-18 (×6): 0.5 mg via INTRAVENOUS

## 2019-10-18 MED ORDER — LIDOCAINE HCL (CARDIAC) PF 100 MG/5ML IV SOSY
PREFILLED_SYRINGE | INTRAVENOUS | Status: DC | PRN
  Administered 2019-10-18: 30 mg via INTRAVENOUS

## 2019-10-18 MED ORDER — POLYETHYLENE GLYCOL 3350 17 G PO PACK: 17.0000 g | PACK | Freq: Every day | ORAL | Status: DC | PRN

## 2019-10-18 MED ORDER — DIPHENHYDRAMINE HCL 12.5 MG/5ML PO ELIX: 12.5000 mg | ORAL_SOLUTION | ORAL | Status: DC | PRN

## 2019-10-18 MED ORDER — MIDAZOLAM HCL 2 MG/2ML IJ SOLN
INTRAMUSCULAR | Status: AC
  Filled 2019-10-18: qty 2

## 2019-10-18 MED ORDER — ACETAMINOPHEN 160 MG/5ML PO SOLN: 325.0000 mg | Freq: Once | ORAL | Status: DC | PRN

## 2019-10-18 MED ORDER — OXYCODONE HCL 5 MG PO TABS
5.0000 mg | ORAL_TABLET | ORAL | Status: DC | PRN
  Administered 2019-10-18 (×2): 5 mg via ORAL
  Administered 2019-10-19: 10 mg via ORAL
  Filled 2019-10-18: qty 2
  Filled 2019-10-18: qty 1
  Filled 2019-10-18 (×2): qty 2

## 2019-10-18 SURGICAL SUPPLY — 44 items
BAG ZIPLOCK 12X15 (MISCELLANEOUS) ×3 IMPLANT
BENZOIN TINCTURE PRP APPL 2/3 (GAUZE/BANDAGES/DRESSINGS) ×3 IMPLANT
BLADE SAW SGTL 18X1.27X75 (BLADE) ×2 IMPLANT
BLADE SAW SGTL 18X1.27X75MM (BLADE) ×1
CLOSURE WOUND 1/2 X4 (GAUZE/BANDAGES/DRESSINGS) ×1
COLLAR OFFSET CORAIL SZ 11 HIP (Stem) ×1 IMPLANT
CORAIL OFFSET COLLAR SZ 11 HIP (Stem) ×3 IMPLANT
COVER PERINEAL POST (MISCELLANEOUS) ×3 IMPLANT
COVER SURGICAL LIGHT HANDLE (MISCELLANEOUS) ×3 IMPLANT
COVER WAND RF STERILE (DRAPES) ×3 IMPLANT
CUP ACET PNNCL SECTR W/GRIP 56 (Hips) ×1 IMPLANT
DRAPE STERI IOBAN 125X83 (DRAPES) ×3 IMPLANT
DRAPE U-SHAPE 47X51 STRL (DRAPES) ×6 IMPLANT
DRESSING AQUACEL AG SP 3.5X10 (GAUZE/BANDAGES/DRESSINGS) ×1 IMPLANT
DRSG AQUACEL AG ADV 3.5X10 (GAUZE/BANDAGES/DRESSINGS) ×3 IMPLANT
DRSG AQUACEL AG SP 3.5X10 (GAUZE/BANDAGES/DRESSINGS) ×3
DURAPREP 26ML APPLICATOR (WOUND CARE) ×3 IMPLANT
ELECT REM PT RETURN 15FT ADLT (MISCELLANEOUS) ×3 IMPLANT
GAUZE XEROFORM 1X8 LF (GAUZE/BANDAGES/DRESSINGS) ×3 IMPLANT
GLOVE BIO SURGEON STRL SZ7.5 (GLOVE) ×3 IMPLANT
GLOVE BIOGEL PI IND STRL 8 (GLOVE) ×2 IMPLANT
GLOVE BIOGEL PI INDICATOR 8 (GLOVE) ×4
GLOVE ECLIPSE 8.0 STRL XLNG CF (GLOVE) ×3 IMPLANT
GOWN STRL REUS W/TWL XL LVL3 (GOWN DISPOSABLE) ×6 IMPLANT
HANDPIECE INTERPULSE COAX TIP (DISPOSABLE) ×2
HEAD M SROM 36MM PLUS 1.5 (Hips) ×1 IMPLANT
HOLDER FOLEY CATH W/STRAP (MISCELLANEOUS) ×3 IMPLANT
KIT TURNOVER KIT A (KITS) IMPLANT
LINER NEUTRAL 52MMX36MMX56N (Liner) ×3 IMPLANT
PACK ANTERIOR HIP CUSTOM (KITS) ×3 IMPLANT
PINN SECTOR W/GRIP ACE CUP 56 (Hips) ×3 IMPLANT
SET HNDPC FAN SPRY TIP SCT (DISPOSABLE) ×1 IMPLANT
SROM M HEAD 36MM PLUS 1.5 (Hips) ×3 IMPLANT
STAPLER VISISTAT 35W (STAPLE) IMPLANT
STRIP CLOSURE SKIN 1/2X4 (GAUZE/BANDAGES/DRESSINGS) ×2 IMPLANT
SUT ETHIBOND NAB CT1 #1 30IN (SUTURE) ×3 IMPLANT
SUT ETHILON 2 0 PS N (SUTURE) IMPLANT
SUT MNCRL AB 4-0 PS2 18 (SUTURE) IMPLANT
SUT VIC AB 0 CT1 36 (SUTURE) ×3 IMPLANT
SUT VIC AB 1 CT1 36 (SUTURE) ×3 IMPLANT
SUT VIC AB 2-0 CT1 27 (SUTURE) ×4
SUT VIC AB 2-0 CT1 TAPERPNT 27 (SUTURE) ×2 IMPLANT
TRAY FOLEY MTR SLVR 16FR STAT (SET/KITS/TRAYS/PACK) ×3 IMPLANT
YANKAUER SUCT BULB TIP 10FT TU (MISCELLANEOUS) ×3 IMPLANT

## 2019-10-18 NOTE — Progress Notes (Signed)
May give additional Dilaudid 1 mg in pacu, Per Dr Smith Robert.

## 2019-10-18 NOTE — Op Note (Signed)
NAME: MIKI, HARTNER MEDICAL RECORD [id number] ACCOUNT [id number] DATE OF BIRTH:1956/06/21 FACILITY: [facility] LOCATION: [facility] PHYSICIAN:CHRISTOPHER Kerry Fort, MD  OPERATIVE REPORT  DATE OF PROCEDURE:  10/18/2019  PREOPERATIVE DIAGNOSIS:  Primary osteoarthritis and degenerative joint disease, left hip.  POSTOPERATIVE DIAGNOSIS:  Primary osteoarthritis and degenerative joint disease, left hip.  PROCEDURE:  Left total hip arthroplasty through direct anterior approach.  IMPLANTS:  DePuy Sector Gription acetabular component size 56, size 36+0 neutral polyethylene liner, size 11 Corail femoral component with high offset, size 36+1.5 metal hip ball.  SURGEON:  Lind Guest. Ninfa Linden, MD  ASSISTANT:  Erskine Emery, PA-C  ANESTHESIA:  Spinal.  ANTIBIOTICS:  2 grams IV Ancef.  ESTIMATED BLOOD LOSS:  400 mL.  COMPLICATIONS:  None.  INDICATIONS:  The patient is a 63 year old gentleman with known and well documented osteoarthritis of his left hip.  We actually performed a right total hip arthroplasty in December of last year due to his severe arthritis in his right hip.  At this  point, his left hip pain has become daily.  His x-rays show complete loss of the joint space.  His left hip pain is detrimentally affecting his mobility, his quality of life, and his activities of daily living to the point he does wish to proceed with  total hip arthroplasty on the left side.  Having had this done before he is fully aware of the risk of acute blood loss anemia, nerve and vessel injury, fracture, infection, dislocation, DVT and implant failure.  We talked about the goals being decreased  pain, improve mobility, and hopefully overall improve quality of life.  DESCRIPTION OF PROCEDURE:  After informed consent was obtained and appropriate left hip was marked he was brought to the operating room and sat up on a stretcher.  Spinal anesthesia was then obtained.  He was laid in supine position on a  stretcher.  A  Foley catheter was placed.  I was able to assess his leg length and then placed traction boots on both his feet.  Next, he was placed supine on the Hana fracture table, the perineal post in place and both legs in line skeletal traction device and no  traction applied.  His left operative hip was prepped and draped with DuraPrep and sterile drapes.  A time-out was called.  He was identified, correct patient, correct left hip.  I then made an incision just inferior and posterior to the anterior  superior iliac spine and carried this obliquely down the leg.  We dissected down tensor fascia lata muscle.  Tensor fascia was then divided longitudinally to proceed with direct anterior approach to the hip.  We identified and cauterized circumflex  vessels and identified the hip capsule, opened the hip capsule in an L-type format, finding moderate joint effusion and significant periarticular osteophytes around the femoral head and neck.  We then next placed curved retractors around the medial and  lateral femoral neck and made our femoral neck cut with an oscillating saw and completed this with an osteotome.  We placed a corkscrew guide in the femoral head and removed the femoral head in entirety and found a wide area devoid of cartilage.  I then  placed a bent Hohmann over the medial acetabular rim and removed remnants of the acetabular labrum and other debris.  I began reaming under direct visualization from a size 44 reamer in stepwise increments up to a size 55 with all reamers under direct  visualization, the last reamer was placed under direct  fluoroscopy, so I could obtain by depth of reaming our inclination and anteversion.  I then placed the real DePuy Sector Gription acetabular component size 56 and we went with a 36+0 neutral  polyethylene liner.  Attention was then turned to the femur.  With the leg externally rotated to 120 degrees, extended and adducted, we were placing Mueller retractor  medially and Hohman retractor behind the greater trochanter.  We released the lateral  joint capsule and used a box-cutting osteotome to enter the femoral canal and a rongeur to lateralize then began broaching using the Corail broaching system from a size 8 going up to a size 11.  This actually corresponded with his other side.  We placed  a high offset femoral trial neck and a 36-2, hip ball rolled the leg back over in upward traction, internal rotation, reducing the pelvis and it was definitely stable radiographically and mechanically assessing it, but we did feel radiographically like  we needed just a little bit more offset and leg length.  We dislocated the hip and removed the trial components.  We then placed the real Corail femoral component size 11 with high offset and the real 36+1.5 metal hip ball.  Again, reduced this in the  acetabulum and it was stable.  We then irrigated the soft tissue with normal saline solution using pulsatile lavage.  We again assessed it radiographically and mechanically before closing.  We then closed the joint capsule with interrupted #1 Ethibond  suture, followed by running 0 Vicryl, closed the tensor fascia, 0 Vicryl was used to close the deep tissue, 2-0 Vicryl was used to close subcutaneous tissue, 4-0 Monocryl subcuticular stitch to close the skin.  An Aquacel dressing was applied.  He was  taken off the Hana table and taken to recovery room in stable condition.  All final counts were correct.  There were no complications noted.    Of note, Benita Stabile, PA-C, assisted the entire case.  His assistance was crucial for facilitating all aspects of this case.  CN/NUANCE  D:10/18/2019 T:10/18/2019 JOB:[id number]

## 2019-10-18 NOTE — Anesthesia Procedure Notes (Signed)
Spinal  Patient location during procedure: OR Start time: 10/18/2019 7:27 AM Staffing Anesthesiologist: [name], MD Resident/CRNA: Gerald Leitz, CRNA Performed: resident/CRNA  Preanesthetic Checklist Completed: patient identified, site marked, surgical consent, pre-op evaluation, timeout performed, IV checked, risks and benefits discussed and monitors and equipment checked Spinal Block Patient position: sitting Prep: DuraPrep Patient monitoring: heart rate, cardiac monitor, continuous pulse ox and blood pressure Approach: midline Location: L3-4 Injection technique: single-shot Needle Needle type: Pencan  Needle gauge: 24 G Needle length: 9 cm Assessment Sensory level: T4

## 2019-10-18 NOTE — Anesthesia Postprocedure Evaluation (Signed)
Anesthesia Post Note  Patient: Juan Powers  Procedure(s) Performed: LEFT TOTAL HIP ARTHROPLASTY ANTERIOR APPROACH (Left Hip)     Patient location during evaluation: PACU Anesthesia Type: Spinal Level of consciousness: oriented and awake and alert Pain management: pain level controlled Vital Signs Assessment: post-procedure vital signs reviewed and stable Respiratory status: spontaneous breathing, respiratory function stable and patient connected to nasal cannula oxygen Cardiovascular status: blood pressure returned to baseline and stable Postop Assessment: no headache, no backache, no apparent nausea or vomiting and spinal receding Anesthetic complications: no    Last Vitals:  Vitals:   10/18/19 1015 10/18/19 1026  BP:  (!) 131/93  Pulse: (!) 57 (!) 52  Resp:  16  Temp:  36.6 C  SpO2: 100% 100%    Last Pain:  Vitals:   10/18/19 1026  TempSrc: Oral  PainSc:                  [name]

## 2019-10-18 NOTE — H&P (Signed)
TOTAL HIP ADMISSION H&P  Patient is admitted for left total hip arthroplasty.  Subjective:  Chief Complaint: left hip pain  HPI: Juan Powers, 63 y.o. male, has a history of pain and functional disability in the left hip(s) due to arthritis and patient has failed non-surgical conservative treatments for greater than 12 weeks to include NSAID's and/or analgesics, corticosteriod injections, flexibility and strengthening excercises, supervised PT with diminished ADL's post treatment, use of assistive devices and activity modification.  Onset of symptoms was gradual starting 2 years ago with gradually worsening course since that time.The patient noted no past surgery on the left hip(s).  Patient currently rates pain in the left hip at 10 out of 10 with activity. Patient has night pain, worsening of pain with activity and weight bearing, pain that interfers with activities of daily living, pain with passive range of motion and crepitus. Patient has evidence of subchondral sclerosis, periarticular osteophytes and joint space narrowing by imaging studies. This condition presents safety issues increasing the risk of falls.  There is no current active infection.  Patient Active Problem List   Diagnosis Date Noted  . Unilateral primary osteoarthritis, left hip 09/02/2019  . Unilateral primary osteoarthritis, right hip 11/23/2018  . 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 Anemia    LATE TEENS EARLY 20S   . Arthritis    RIGHT HIP   . Basal cell carcinoma    MULTIPLE OVER THE YEARS  . Hepatitis    HEP A IN THE EARLY 80s , TREATED AND RESOLVED   . Tinnitus     Past Surgical History:  Procedure Laterality Date  . COLONOSCOPY    . TONSILLECTOMY     CHILDHOOD   . TOTAL HIP ARTHROPLASTY Right 11/23/2018   Procedure: RIGHT TOTAL HIP ARTHROPLASTY ANTERIOR APPROACH;  Surgeon: Mcarthur Rossetti, MD;  Location: [facility];  Service: Orthopedics;   Laterality: Right;    Current Facility-Administered Medications  Medication Dose Route Frequency Provider Last Rate Last Dose  . ceFAZolin (ANCEF) IVPB 2g/100 mL premix  2 g Intravenous On Call to OR Pete Pelt, PA-C      . lactated ringers infusion   Intravenous Continuous [name], MD 50 mL/hr at 10/18/19 [phone]    . tranexamic acid (CYKLOKAPRON) IVPB 1,000 mg  1,000 mg Intravenous To OR Pete Pelt, PA-C       Allergies  Allergen Reactions  . Other Shortness Of Breath and Swelling    Pan alba    Social History   Tobacco Use  . Smoking status: Former Smoker    Packs/day: 1.00    Years: 20.00    Pack years: 20.00    Types: Cigarettes    Quit date: 2000    Years since quitting: 20.8  . Smokeless tobacco: Never Used  Substance Use Topics  . Alcohol use: Yes    Alcohol/week: 0.0 standard drinks    Comment: OCC    Family History  Problem Relation Age of Onset  . Cancer Father   . Heart disease Father   . Cancer Sister      Review of Systems  Musculoskeletal: Positive for joint pain.  All other systems reviewed and are negative.   Objective:  Physical Exam  Constitutional: He is oriented to person, place, and time. He appears well-developed and well-nourished.  HENT:  Head: Normocephalic and atraumatic.  Eyes: Pupils are equal, round, and reactive to light.  EOM are normal.  Neck: Normal range of motion. Neck supple.  Cardiovascular: Normal rate and regular rhythm.  Respiratory: Effort normal and breath sounds normal.  GI: Soft. Bowel sounds are normal.  Musculoskeletal:     Left hip: He exhibits decreased range of motion, decreased strength, tenderness and bony tenderness.  Neurological: He is alert and oriented to person, place, and time.  Skin: Skin is warm and dry.  Psychiatric: He has a normal mood and affect.    Vital signs in last 24 hours: Temp:  [99.4 F (37.4 C)] 99.4 F (37.4 C) (11/13 0600) Pulse Rate:  [59] 59 (11/13 0600) Resp:   [16] 16 (11/13 0600) BP: (117)/(77) 117/77 (11/13 0600) SpO2:  [98 %] 98 % (11/13 0600)  Labs:   Estimated body mass index is 28.41 kg/m as calculated from the following:   Height as of 10/15/19: 6\' 2"  (1.88 m).   Weight as of 10/15/19: 100.4 kg.   Imaging Review Plain radiographs demonstrate severe degenerative joint disease of the left hip(s). The bone quality appears to be excellent for age and reported activity level.      Assessment/Plan:  End stage arthritis, left hip(s)  The patient history, physical examination, clinical judgement of the provider and imaging studies are consistent with end stage degenerative joint disease of the left hip(s) and total hip arthroplasty is deemed medically necessary. The treatment options including medical management, injection therapy, arthroscopy and arthroplasty were discussed at length. The risks and benefits of total hip arthroplasty were presented and reviewed. The risks due to aseptic loosening, infection, stiffness, dislocation/subluxation,  thromboembolic complications and other imponderables were discussed.  The patient acknowledged the explanation, agreed to proceed with the plan and consent was signed. Patient is being admitted for inpatient treatment for surgery, pain control, PT, OT, prophylactic antibiotics, VTE prophylaxis, progressive ambulation and ADL's and discharge planning.The patient is planning to be discharged home with home health services

## 2019-10-18 NOTE — Evaluation (Signed)
Physical Therapy Evaluation Patient Details Name: Juan Powers MRN: [id number] DOB: 1956/09/13 Today's Date: 10/18/2019   History of Present Illness  s/p L DA THA  Clinical Impression  Pt is s/p THA resulting in the deficits listed below (see PT Problem List).  Pt amb ~ 59' with RW and  min assist,  Dizzy after this distance so advised pt to sit, dizziness resolved, pt agreeable to staying up in chair and RN made aware.   Pt will benefit from skilled PT to increase their independence and safety with mobility to allow discharge to the venue listed below.      Follow Up Recommendations Follow surgeon's recommendation for DC plan and follow-up therapies    Equipment Recommendations  None recommended by PT    Recommendations for Other Services       Precautions / Restrictions Precautions Precautions: Fall Restrictions Weight Bearing Restrictions: No      Mobility  Bed Mobility Overal bed mobility: Needs Assistance Bed Mobility: Supine to Sit     Supine to sit: Min guard     General bed mobility comments: for safety  Transfers Overall transfer level: Needs assistance Equipment used: Rolling walker (2 wheeled) Transfers: Sit to/from Stand Sit to Stand: Min assist         General transfer comment: cues for hand placement and LLE position  Ambulation/Gait Ambulation/Gait assistance: Min guard;Min assist Gait Distance (Feet): 60 Feet Assistive device: Rolling walker (2 wheeled) Gait Pattern/deviations: Step-to pattern;Decreased stance time - left     General Gait Details: cues for  RW position and initial sequence  Stairs            Wheelchair Mobility    Modified Rankin (Stroke Patients Only)       Balance                                             Pertinent Vitals/Pain Pain Assessment: 0-10 Pain Score: 2  Pain Location: left hip Pain Descriptors / Indicators: Grimacing;Sore Pain Intervention(s): Premedicated before  session;Monitored during session;Limited activity within patient's tolerance;Ice applied    Home Living Family/patient expects to be discharged to:: Private residence Living Arrangements: Spouse/significant other Available Help at Discharge: Family Type of Home: House Home Access: Stairs to enter Entrance Stairs-Rails: None Entrance Stairs-Number of Steps: 2-3 Home Layout: Able to live on main level with bedroom/bathroom Home Equipment: Walker - 2 wheels;Bedside commode;Shower seat;Cane - single point      Prior Function Level of Independence: Independent               Hand Dominance        Extremity/Trunk Assessment   Upper Extremity Assessment Upper Extremity Assessment: Overall WFL for tasks assessed    Lower Extremity Assessment Lower Extremity Assessment: LLE deficits/detail LLE Deficits / Details: ankle WFL, knee and hip grossly 3/5, full ROM limited by pain       Communication   Communication: No difficulties  Cognition Arousal/Alertness: Awake/alert Behavior During Therapy: WFL for tasks assessed/performed Overall Cognitive Status: Within Functional Limits for tasks assessed                                        General Comments      Exercises     Assessment/Plan  PT Assessment Patient needs continued PT services  PT Problem List Decreased strength;Decreased range of motion;Decreased activity tolerance;Pain;Decreased knowledge of use of DME;Decreased mobility;Decreased balance       PT Treatment Interventions DME instruction;Therapeutic exercise;Functional mobility training;Therapeutic activities;Patient/family education;Stair training;Gait training    PT Goals (Current goals can be found in the Care Plan section)  Acute Rehab PT Goals PT Goal Formulation: With patient Time For Goal Achievement: 10/25/19 Potential to Achieve Goals: Good    Frequency 7X/week   Barriers to discharge        Co-evaluation                AM-PAC PT "6 Clicks" Mobility  Outcome Measure Help needed turning from your back to your side while in a flat bed without using bedrails?: A Little Help needed moving from lying on your back to sitting on the side of a flat bed without using bedrails?: A Little Help needed moving to and from a bed to a chair (including a wheelchair)?: A Little Help needed standing up from a chair using your arms (e.g., wheelchair or bedside chair)?: A Little Help needed to walk in hospital room?: A Little Help needed climbing 3-5 steps with a railing? : A Lot 6 Click Score: 17    End of Session Equipment Utilized During Treatment: Gait belt Activity Tolerance: Patient tolerated treatment well Patient left: with call bell/phone within reach;with family/visitor present;in chair;with chair alarm set Nurse Communication: Mobility status PT Visit Diagnosis: Difficulty in walking, not elsewhere classified (R26.2)    Time: [id number] PT Time Calculation (min) (ACUTE ONLY): 23 min   Charges:   PT Evaluation $PT Eval Low Complexity: 1 Low PT Treatments $Gait Training: 8-22 mins        Kenyon Ana, PT  Pager: [phone] Acute Rehab Dept [facility]): [phone]   10/18/2019   [facility] 10/18/2019, 4:05 PM

## 2019-10-18 NOTE — Anesthesia Preprocedure Evaluation (Addendum)
Anesthesia Evaluation  Patient identified by MRN, date of birth, ID band Patient awake    Reviewed: Allergy & Precautions, NPO status , Patient's Chart, lab work & pertinent test results  Airway Mallampati: I  TM Distance: >3 FB Neck ROM: Full    Dental  (+) Teeth Intact, Dental Advisory Given   Pulmonary former smoker,    breath sounds clear to auscultation       Cardiovascular negative cardio ROS   Rhythm:Regular Rate:Normal     Neuro/Psych negative neurological ROS  negative psych ROS   GI/Hepatic negative GI ROS, (+) Hepatitis -, A  Endo/Other  negative endocrine ROS  Renal/GU negative Renal ROS     Musculoskeletal  (+) Arthritis ,   Abdominal Normal abdominal exam  (+)   Peds  Hematology negative hematology ROS (+)   Anesthesia Other Findings   Reproductive/Obstetrics                            Lab Results  Component Value Date   WBC 7.3 10/15/2019   HGB 14.4 10/15/2019   HCT 44.8 10/15/2019   MCV 94.7 10/15/2019   PLT 272 10/15/2019   No results found for: INR, PROTIME   Anesthesia Physical Anesthesia Plan  ASA: II  Anesthesia Plan: Spinal   Post-op Pain Management:    Induction: Intravenous  PONV Risk Score and Plan: 2 and Ondansetron and Propofol infusion  Airway Management Planned: Simple Face Mask and Natural Airway  Additional Equipment: None  Intra-op Plan:   Post-operative Plan:   Informed Consent: I have reviewed the patients History and Physical, chart, labs and discussed the procedure including the risks, benefits and alternatives for the proposed anesthesia with the patient or authorized representative who has indicated his/her understanding and acceptance.       Plan Discussed with: CRNA  Anesthesia Plan Comments:        Anesthesia Quick Evaluation

## 2019-10-18 NOTE — Brief Op Note (Signed)
10/18/2019  8:36 AM  PATIENT:  Juan Powers  63 y.o. male  PRE-OPERATIVE DIAGNOSIS:  osteoarthritis left hip  POST-OPERATIVE DIAGNOSIS:  osteoarthritis left hip  PROCEDURE:  Procedure(s): LEFT TOTAL HIP ARTHROPLASTY ANTERIOR APPROACH (Left)  SURGEON:  Surgeon(s) and Role:    Mcarthur Rossetti, MD - Primary  PHYSICIAN ASSISTANT: Benita Stabile, PA-C  ANESTHESIA:   spinal  EBL:  400 mL   COUNTS:  YES  DICTATION: .Other Dictation: Dictation Number [id number]  PLAN OF CARE: Admit to inpatient   PATIENT DISPOSITION:  PACU - hemodynamically stable.   Delay start of Pharmacological VTE agent (>24hrs) due to surgical blood loss or risk of bleeding: no

## 2019-10-18 NOTE — Transfer of Care (Signed)
Immediate Anesthesia Transfer of Care Note  Immediate Anesthesia Transfer of Care Note  Patient: Juan Powers  Procedure(s) Performed: Procedure(s): LEFT TOTAL HIP ARTHROPLASTY ANTERIOR APPROACH (Left)  Patient Location: PACU  Anesthesia Type:Spinal  Level of Consciousness: awake, alert  and oriented  Airway & Oxygen Therapy: Patient Spontanous Breathing  Post-op Assessment: Report given to RN and Post -op Vital signs reviewed and stable  Post vital signs: Reviewed and stable  Last Vitals:  Vitals:   10/18/19 0600  BP: 117/77  Pulse: (!) 59  Resp: 16  Temp: 37.4 C  SpO2: [id number]    Complications: No apparent anesthesia complications

## 2019-10-19 LAB — BASIC METABOLIC PANEL
Anion gap: 5 (ref 5–15)
BUN: 18 mg/dL (ref 8–23)
CO2: 24 mmol/L (ref 22–32)
Calcium: 7.6 mg/dL — ABNORMAL LOW (ref 8.9–10.3)
Chloride: 103 mmol/L (ref 98–111)
Creatinine, Ser: 1.17 mg/dL (ref 0.61–1.24)
GFR calc Af Amer: >60 mL/min (ref >60)
GFR calc non Af Amer: >60 mL/min (ref >60)
Glucose, Bld: 129 mg/dL — ABNORMAL HIGH (ref 70–99)
Potassium: 3.7 mmol/L (ref 3.5–5.1)
Sodium: 132 mmol/L — ABNORMAL LOW (ref 135–145)

## 2019-10-19 LAB — CBC
HCT: 33.5 % — ABNORMAL LOW (ref 39.0–52.0)
Hemoglobin: 10.9 g/dL — ABNORMAL LOW (ref 13.0–17.0)
MCH: 30.8 pg (ref 26.0–34.0)
MCHC: 32.5 g/dL (ref 30.0–36.0)
MCV: 94.6 fL (ref 80.0–100.0)
Platelets: 205 10*3/uL (ref 150–400)
RBC: 3.54 MIL/uL — ABNORMAL LOW (ref 4.22–5.81)
RDW: 12.1 % (ref 11.5–15.5)
WBC: 7.6 10*3/uL (ref 4.0–10.5)
nRBC: 0 % (ref 0.0–0.2)

## 2019-10-19 MED ORDER — METHOCARBAMOL 500 MG PO TABS: 500.0000 mg | ORAL_TABLET | Freq: Three times a day (TID) | ORAL | 1 refills | Status: DC | PRN

## 2019-10-19 MED ORDER — ASPIRIN 81 MG PO CHEW: 81.0000 mg | CHEWABLE_TABLET | Freq: Two times a day (BID) | ORAL | 0 refills | Status: DC

## 2019-10-19 MED ORDER — OXYCODONE HCL 5 MG PO TABS: 5.0000 mg | ORAL_TABLET | ORAL | 0 refills | Status: DC | PRN

## 2019-10-19 NOTE — TOC Progression Note (Signed)
Transition of Care [facility]) - Progression Note    Patient Details  Name: Juan Powers MRN: [id number] Date of Birth: 05/20/1956  Transition of Care [facility]) CM/SW Contact  Joaquin Courts, RN Phone Number: 10/19/2019, 11:19 AM  Clinical Narrative:  CM spoke with patient at bedside. Patient plans to DC home with HHPT, reports has rolling walker and 3-in-1 at home. CM called referral to [facility] for [facility] agency assignment, intake ID [id number]. [facility] to contact patient and notify when [facility] agency has been assigned.       Expected Discharge Plan: [place] Barriers to Discharge: No Barriers Identified  Expected Discharge Plan and Services Expected Discharge Plan: [place]   Discharge Planning Services: CM Consult   Living arrangements for the past 2 months: Single Family Home Expected Discharge Date: 10/19/19               DME Arranged: N/A DME Agency: NA       HH Arranged: (referral called to [place] [facility])           Social Determinants of Health (SDOH) Interventions    Readmission Risk Interventions No flowsheet data found.

## 2019-10-19 NOTE — Progress Notes (Signed)
Subjective: 1 Day Post-Op Procedure(s) (LRB): LEFT TOTAL HIP ARTHROPLASTY ANTERIOR APPROACH (Left) Patient reports pain as mild to moderate with some spasms. Spasms resolved.   Objective: Vital signs in last 24 hours: Temp:  [97.8 F (36.6 C)-98.9 F (37.2 C)] 98 F (36.7 C) (11/14 0914) Pulse Rate:  [46-83] 83 (11/14 0914) Resp:  [8-18] 15 (11/14 0914) BP: (98-131)/(63-106) 112/67 (11/14 0914) SpO2:  [93 %-100 %] 93 % (11/14 0914) Weight:  [99.8 kg] 99.8 kg (11/13 1026)  Intake/Output from previous day: 11/13 0701 - 11/14 0700 In: 3533.4 [P.O.:480; I.V.:2953.4; IV Piggyback:100] Out: 2875 [Urine:2475; Blood:400] Intake/Output this shift: Total I/O In: 240 [P.O.:240] Out: -   Recent Labs    10/19/19 0256  HGB 10.9*   Recent Labs    10/19/19 0256  WBC 7.6  RBC 3.54*  HCT 33.5*  PLT 205   Recent Labs    10/19/19 0256  NA 132*  K 3.7  CL 103  CO2 24  BUN 18  CREATININE 1.17  GLUCOSE 129*  CALCIUM 7.6*   No results for input(s): LABPT, INR in the last 72 hours.  Left lower extremity: Neurovascular intact Dorsiflexion/Plantar flexion intact Incision: dressing C/D/I Compartment soft   Assessment/Plan: 1 Day Post-Op Procedure(s) (LRB): LEFT TOTAL HIP ARTHROPLASTY ANTERIOR APPROACH (Left) Up with therapy Discharge home with home health     Erskine Emery 10/19/2019, 9:28 AM

## 2019-10-19 NOTE — Progress Notes (Signed)
Physical Therapy Treatment Patient Details Name: Juan Powers MRN: [id number] DOB: 04/27/1956 Today's Date: 10/19/2019    History of Present Illness s/p L DA THA    PT Comments    Pt progressing well. Will see for a second session and should be ready for d/c later this afternoon  Follow Up Recommendations  Follow surgeon's recommendation for DC plan and follow-up therapies     Equipment Recommendations  None recommended by PT    Recommendations for Other Services       Precautions / Restrictions Precautions Precautions: Fall Restrictions Weight Bearing Restrictions: No    Mobility  Bed Mobility Overal bed mobility: Needs Assistance       Supine to sit: Supervision     General bed mobility comments: for safety  Transfers Overall transfer level: Needs assistance Equipment used: Rolling walker (2 wheeled) Transfers: Sit to/from Stand Sit to Stand: Min guard         General transfer comment: cues for hand placement and LLE position  Ambulation/Gait Ambulation/Gait assistance: Min guard;Supervision Gait Distance (Feet): 160 Feet Assistive device: Rolling walker (2 wheeled) Gait Pattern/deviations: Step-to pattern;Decreased stance time - left     General Gait Details: cues for  RW position and initial sequence, incr step length    Stairs             Wheelchair Mobility    Modified Rankin (Stroke Patients Only)       Balance                                            Cognition Arousal/Alertness: Awake/alert Behavior During Therapy: WFL for tasks assessed/performed Overall Cognitive Status: Within Functional Limits for tasks assessed                                        Exercises      General Comments        Pertinent Vitals/Pain Pain Assessment: 0-10 Pain Score: 4  Pain Location: left hip Pain Descriptors / Indicators: Grimacing;Sore Pain Intervention(s): Limited activity within patient's  tolerance;Monitored during session;Premedicated before session;Repositioned;Ice applied    Home Living                      Prior Function            PT Goals (current goals can now be found in the care plan section) Acute Rehab PT Goals PT Goal Formulation: With patient Time For Goal Achievement: 10/25/19 Potential to Achieve Goals: Good Progress towards PT goals: Progressing toward goals    Frequency    7X/week      PT Plan Current plan remains appropriate    Co-evaluation              AM-PAC PT "6 Clicks" Mobility   Outcome Measure  Help needed turning from your back to your side while in a flat bed without using bedrails?: A Little Help needed moving from lying on your back to sitting on the side of a flat bed without using bedrails?: A Little Help needed moving to and from a bed to a chair (including a wheelchair)?: A Little Help needed standing up from a chair using your arms (e.g., wheelchair or bedside chair)?: A Little Help needed to walk in  hospital room?: A Little Help needed climbing 3-5 steps with a railing? : A Little 6 Click Score: 18    End of Session Equipment Utilized During Treatment: Gait belt Activity Tolerance: Patient tolerated treatment well Patient left: with call bell/phone within reach;with family/visitor present;in chair;with chair alarm set Nurse Communication: Mobility status PT Visit Diagnosis: Difficulty in walking, not elsewhere classified (R26.2)     Time: [id number] PT Time Calculation (min) (ACUTE ONLY): 20 min  Charges:  $Gait Training: 8-22 mins                     Kenyon Ana, PT  Pager: [phone] Acute Rehab Dept [facility]): [phone]   10/19/2019    [facility] 10/19/2019, 1:25 PM

## 2019-10-19 NOTE — Discharge Instructions (Signed)

## 2019-10-19 NOTE — Discharge Summary (Addendum)
Patient ID: Juan Powers MRN: [id number] DOB/AGE: Sep 28, 1956 63 y.o.  Admit date: 10/18/2019 Discharge date: 10/19/2019  Admission Diagnoses:  Principal Problem:   Unilateral primary osteoarthritis, left hip Active Problems:   Status post total replacement of left hip   Discharge Diagnoses:  Same  Past Medical History:  Diagnosis Date  . Anemia    LATE TEENS EARLY 20S   . Arthritis    RIGHT HIP   . Basal cell carcinoma    MULTIPLE OVER THE YEARS  . Hepatitis    HEP A IN THE EARLY 80s , TREATED AND RESOLVED   . Tinnitus     Surgeries: Procedure(s): LEFT TOTAL HIP ARTHROPLASTY ANTERIOR APPROACH on 10/18/2019   Consultants:   Discharged Condition: Improved  Hospital Course: Juan Powers is an 63 y.o. male who was admitted 10/18/2019 for operative treatment ofUnilateral primary osteoarthritis, left hip. Patient has severe unremitting pain that affects sleep, daily activities, and work/hobbies. After pre-op clearance the patient was taken to the operating room on 10/18/2019 and underwent  Procedure(s): LEFT TOTAL HIP ARTHROPLASTY ANTERIOR APPROACH.    Patient was given perioperative antibiotics:  Anti-infectives (From admission, onward)   Start     Dose/Rate Route Frequency Ordered Stop   10/18/19 1330  ceFAZolin (ANCEF) IVPB 1 g/50 mL premix     1 g 100 mL/hr over 30 Minutes Intravenous Every 6 hours 10/18/19 1028 10/18/19 2214   10/18/19 0600  ceFAZolin (ANCEF) IVPB 2g/100 mL premix     2 g 200 mL/hr over 30 Minutes Intravenous On call to O.R. 10/18/19 0534 10/18/19 0725       Patient was given sequential compression devices, early ambulation, and chemoprophylaxis to prevent DVT.  Patient benefited maximally from hospital stay and there were no complications.    Recent vital signs:  Patient Vitals for the past 24 hrs:  BP Temp Temp src Pulse Resp SpO2 Height Weight  10/19/19 0914 112/67 98 F (36.7 C) Oral 83 15 93 % - -  10/19/19 0523 107/66 98.9 F (37.2  C) - 74 18 93 % - -  10/19/19 0148 109/63 98.3 F (36.8 C) - 73 18 94 % - -  10/18/19 1959 116/64 98.5 F (36.9 C) - 74 18 95 % - -  10/18/19 1637 106/66 97.9 F (36.6 C) Oral (!) 56 15 97 % - -  10/18/19 1326 122/74 98 F (36.7 C) Oral 66 12 97 % - -  10/18/19 1239 131/83 98.1 F (36.7 C) Oral 64 18 97 % - -  10/18/19 1130 120/75 98 F (36.7 C) - (!) 56 15 98 % - -  10/18/19 1026 (!) 131/93 97.8 F (36.6 C) Oral (!) 52 16 100 % 6\' 2"  (1.88 m) 99.8 kg  10/18/19 1015 - - - (!) 57 - 100 % - -  10/18/19 1013 (!) 129/106 - - (!) 46 (!) 8 100 % - -  10/18/19 1000 108/71 - - (!) 57 14 100 % - -  10/18/19 0945 (!) 119/102 - - (!) 58 12 98 % - -  10/18/19 0935 - - - (!) 59 13 99 % - -  10/18/19 0930 98/72 - - (!) 56 12 98 % - -     Recent laboratory studies:  Recent Labs    10/19/19 0256  WBC 7.6  HGB 10.9*  HCT 33.5*  PLT 205  NA 132*  K 3.7  CL 103  CO2 24  BUN 18  CREATININE 1.17  GLUCOSE 129*  CALCIUM 7.6*     Discharge Medications:   Allergies as of 10/19/2019      Reactions   Other Shortness Of Breath, Swelling   Pan alba      Medication List    STOP taking these medications   ibuprofen 200 MG tablet Commonly known as: ADVIL     TAKE these medications   aspirin 81 MG chewable tablet Chew 1 tablet (81 mg total) by mouth 2 (two) times daily.   cetirizine 10 MG tablet Commonly known as: ZYRTEC Take 10 mg by mouth daily.   methocarbamol 500 MG tablet Commonly known as: ROBAXIN Take 1 tablet (500 mg total) by mouth every 8 (eight) hours as needed for muscle spasms.   oxyCODONE 5 MG immediate release tablet Commonly known as: Oxy IR/ROXICODONE Take 1-2 tablets (5-10 mg total) by mouth every 4 (four) hours as needed for moderate pain (pain score 4-6).            Durable Medical Equipment  (From admission, onward)         Start     Ordered   10/18/19 1029  DME Walker rolling  Once    Question:  Patient needs a walker to treat with the  following condition  Answer:  Status post total replacement of left hip   10/18/19 1028   10/18/19 1029  DME 3 n 1  Once     10/18/19 1028          Diagnostic Studies: Dg Pelvis Portable  Result Date: 10/18/2019 CLINICAL DATA:  Postop left total hip arthroplasty. EXAM: PORTABLE PELVIS 1-2 VIEWS COMPARISON:  None FINDINGS: Status post left total hip arthroplasty. The hardware components are in anatomic alignment. There is no periprosthetic fracture or subluxation identified. Previous right hip arthroplasty is unchanged. No radio-opaque foreign bodies or soft tissue calcifications. IMPRESSION: Status post left total hip arthroplasty.  No complications. Electronically Signed   By: Kerby Moors M.D.   On: 10/18/2019 09:32   Dg C-arm 1-60 Min-no Report  Result Date: 10/18/2019 Fluoroscopy was utilized by the requesting physician.  No radiographic interpretation.   Dg Hip Operative Unilat W Or W/o Pelvis Left  Result Date: 10/18/2019 CLINICAL DATA:  Left hip arthroplasty. EXAM: OPERATIVE left HIP (WITH PELVIS IF PERFORMED) 2 VIEWS TECHNIQUE: Fluoroscopic spot image(s) were submitted for interpretation post-operatively. COMPARISON:  09/02/2019 FINDINGS: Examination demonstrates interval placement of left total hip arthroplasty. There is 5 mm space between the medial aspect of the proximal femoral component of the prosthesis and adjacent femur. Partially visualized right hip arthroplasty unchanged. Recommend correlation with findings at the time of the procedure. IMPRESSION: Left total hip arthroplasty as described. Electronically Signed   By: Marin Olp M.D.   On: 10/18/2019 08:51    Disposition: Discharge disposition: 01-Home or Self Care         Follow-up Information    Mcarthur Rossetti, MD. Schedule an appointment as soon as possible for a visit in 2 day(s).   Specialty: Orthopedic Surgery Contact information: [address] [place] [address] [phone]             Signed: Erskine Emery 10/19/2019, 9:27 AM

## 2019-10-19 NOTE — Progress Notes (Signed)
   10/19/19 1400  PT Visit Information  Last PT Received On 10/19/19  Pt progressing well. Reviewed stairs and pt able to perform without difficulty. Ready for d/c from PT standpoint.   Assistance Needed +1  History of Present Illness s/p L DA THA  Precautions  Precautions Fall  Restrictions  Weight Bearing Restrictions No  Pain Assessment  Pain Assessment 0-10  Pain Score 3  Pain Location left hip  Pain Descriptors / Indicators Grimacing;Sore  Pain Intervention(s) Monitored during session;Premedicated before session;Repositioned  Cognition  Arousal/Alertness Awake/alert  Behavior During Therapy WFL for tasks assessed/performed  Overall Cognitive Status Within Functional Limits for tasks assessed  Transfers  Overall transfer level Needs assistance  Equipment used Rolling walker (2 wheeled)  Transfers Sit to/from Stand  Sit to Stand Supervision  General transfer comment cues for hand placement and LLE position  Ambulation/Gait  Ambulation/Gait assistance Supervision  Gait Distance (Feet) 60 Feet  Assistive device Rolling walker (2 wheeled)  Gait Pattern/deviations Step-to pattern;Decreased stance time - left  General Gait Details cues for step length and RW position  Stairs Yes  Stairs assistance Min assist;Min guard  Stair Management Backwards;Step to pattern;With walker  Number of Stairs 2  General stair comments cues for technique and sequence  PT - End of Session  Equipment Utilized During Treatment Gait belt  Activity Tolerance Patient tolerated treatment well  Patient left in bed;Other (comment) (EOB, NT aware)  Nurse Communication Mobility status   PT - Assessment/Plan  PT Plan Current plan remains appropriate  PT Visit Diagnosis Difficulty in walking, not elsewhere classified (R26.2)  PT Frequency (ACUTE ONLY) 7X/week  Follow Up Recommendations Follow surgeon's recommendation for DC plan and follow-up therapies  PT equipment None recommended by PT  AM-PAC PT  "6 Clicks" Mobility Outcome Measure (Version 2)  Help needed turning from your back to your side while in a flat bed without using bedrails? 3  Help needed moving from lying on your back to sitting on the side of a flat bed without using bedrails? 3  Help needed moving to and from a bed to a chair (including a wheelchair)? 3  Help needed standing up from a chair using your arms (e.g., wheelchair or bedside chair)? 3  Help needed to walk in hospital room? 3  Help needed climbing 3-5 steps with a railing?  3  6 Click Score 18  Consider Recommendation of Discharge To: Home with [facility]  PT Goal Progression  Progress towards PT goals Progressing toward goals  Acute Rehab PT Goals  PT Goal Formulation With patient  Time For Goal Achievement 10/25/19  Potential to Achieve Goals Good  PT Time Calculation  PT Start Time (ACUTE ONLY) 1420  PT Stop Time (ACUTE ONLY) 1430  PT Time Calculation (min) (ACUTE ONLY) 10 min  PT General Charges  $$ ACUTE PT VISIT 1 Visit  PT Treatments  $Gait Training 8-22 mins

## 2019-10-21 ENCOUNTER — Encounter (HOSPITAL_COMMUNITY): Payer: Self-pay | Admitting: Orthopaedic Surgery

## 2019-10-24 ENCOUNTER — Telehealth: Payer: Self-pay | Admitting: Orthopaedic Surgery

## 2019-10-24 MED ORDER — OXYCODONE HCL 5 MG PO TABS: 5.0000 mg | ORAL_TABLET | ORAL | 0 refills | Status: DC | PRN

## 2019-10-24 NOTE — Telephone Encounter (Signed)
Patient left a voicemail message requesting an RX refill on Oxycodone.  CB#[phone].  Thank you.

## 2019-10-24 NOTE — Telephone Encounter (Signed)
I sent in some  

## 2019-10-24 NOTE — Telephone Encounter (Signed)
Please advise 

## 2019-10-25 NOTE — Telephone Encounter (Signed)
Patient called in and was advised of the RX being sent to his pharmacy.

## 2019-11-04 ENCOUNTER — Other Ambulatory Visit: Payer: Self-pay

## 2019-11-04 ENCOUNTER — Encounter: Payer: Self-pay | Admitting: Orthopaedic Surgery

## 2019-11-04 ENCOUNTER — Ambulatory Visit (INDEPENDENT_AMBULATORY_CARE_PROVIDER_SITE_OTHER): Payer: Managed Care, Other (non HMO) | Admitting: Orthopaedic Surgery

## 2019-11-04 DIAGNOSIS — Z96642 Presence of left artificial hip joint: Secondary | ICD-10-CM

## 2019-11-04 MED ORDER — OXYCODONE HCL 5 MG PO TABS: 5.0000 mg | ORAL_TABLET | ORAL | 0 refills | Status: DC | PRN

## 2019-11-04 NOTE — Progress Notes (Signed)
The patient is just over 2 weeks status post a left total hip arthroplasty.  He is almost a year out from his right hip replacement.  He is doing well overall.  His only complaint is having to get in a comfortable position to sleep at night.  He is still needing some oxycodone occasionally for that.  I agree and will send some in for him.  I also recommended he stop his aspirin and try an anti-inflammatory at night.  His leg lengths are equal on exam.  His left hip incision looks good and there is no issues.  At this point he will continue increase his activities as comfort allows.  He can stop his aspirin.  I will send in some more oxycodone and have recommended some Advil to try as well as Tylenol on occasion.  All question concerns were answered addressed.  We will see him back in about 4 to 5 weeks to see how he is doing overall but no x-rays are needed.

## 2019-11-21 ENCOUNTER — Ambulatory Visit: Payer: HRSA Program | Attending: Internal Medicine

## 2019-11-21 DIAGNOSIS — Z20828 Contact with and (suspected) exposure to other viral communicable diseases: Secondary | ICD-10-CM | POA: Insufficient documentation

## 2019-11-21 DIAGNOSIS — Z20822 Contact with and (suspected) exposure to covid-19: Secondary | ICD-10-CM

## 2019-11-22 LAB — NOVEL CORONAVIRUS, NAA: SARS-CoV-2, NAA: NOT DETECTED

## 2019-12-02 ENCOUNTER — Other Ambulatory Visit: Payer: Self-pay

## 2019-12-02 ENCOUNTER — Ambulatory Visit (INDEPENDENT_AMBULATORY_CARE_PROVIDER_SITE_OTHER): Payer: BC Managed Care – PPO | Admitting: Orthopaedic Surgery

## 2019-12-02 ENCOUNTER — Encounter: Payer: Self-pay | Admitting: Orthopaedic Surgery

## 2019-12-02 DIAGNOSIS — Z96642 Presence of left artificial hip joint: Secondary | ICD-10-CM

## 2019-12-02 NOTE — Progress Notes (Signed)
The patient is now 6 weeks status post a left total hip arthroplasty.  He says he is doing well but he does get a click sensation when he is going down a hill or incline.  There is no discomfort with that.  He denies any leg length discrepancy that he is aware of.  He walks without a limp.  On examination his leg lengths are equal.  He tolerates me easily put in both hips to range of motion with no difficulty at all.  I cannot reproduce the clicking sensation.  I gave him reassurance that do feel that this will resolve as he gets further out from surgery and his muscles around the hip strengthening and soft tissue scarring.  If there is any issues he will let me know.  I will give him a note to return to full work duties without restrictions.  We do not need to see him back for 6 months unless there is issues.  At that visit like a standing low AP pelvis and lateral of his left operative hip.

## 2019-12-12 DIAGNOSIS — L988 Other specified disorders of the skin and subcutaneous tissue: Secondary | ICD-10-CM | POA: Diagnosis not present

## 2019-12-12 DIAGNOSIS — C44619 Basal cell carcinoma of skin of left upper limb, including shoulder: Secondary | ICD-10-CM | POA: Diagnosis not present

## 2019-12-12 DIAGNOSIS — C44529 Squamous cell carcinoma of skin of other part of trunk: Secondary | ICD-10-CM | POA: Diagnosis not present

## 2020-01-28 ENCOUNTER — Ambulatory Visit: Payer: BC Managed Care – PPO | Attending: Internal Medicine

## 2020-01-28 DIAGNOSIS — Z20822 Contact with and (suspected) exposure to covid-19: Secondary | ICD-10-CM | POA: Diagnosis not present

## 2020-01-29 LAB — NOVEL CORONAVIRUS, NAA: SARS-CoV-2, NAA: NOT DETECTED

## 2020-02-12 ENCOUNTER — Ambulatory Visit: Payer: BC Managed Care – PPO | Attending: Internal Medicine

## 2020-02-12 DIAGNOSIS — Z20822 Contact with and (suspected) exposure to covid-19: Secondary | ICD-10-CM | POA: Diagnosis not present

## 2020-02-13 LAB — NOVEL CORONAVIRUS, NAA: SARS-CoV-2, NAA: NOT DETECTED

## 2020-02-17 ENCOUNTER — Ambulatory Visit: Payer: Self-pay | Admitting: Family

## 2020-03-16 ENCOUNTER — Ambulatory Visit: Payer: Self-pay | Admitting: Internal Medicine

## 2020-03-20 DIAGNOSIS — Z0000 Encounter for general adult medical examination without abnormal findings: Secondary | ICD-10-CM | POA: Diagnosis not present

## 2020-03-20 DIAGNOSIS — R7401 Elevation of levels of liver transaminase levels: Secondary | ICD-10-CM | POA: Diagnosis not present

## 2020-04-06 DIAGNOSIS — L821 Other seborrheic keratosis: Secondary | ICD-10-CM | POA: Diagnosis not present

## 2020-04-06 DIAGNOSIS — Z85828 Personal history of other malignant neoplasm of skin: Secondary | ICD-10-CM | POA: Diagnosis not present

## 2020-04-06 DIAGNOSIS — L570 Actinic keratosis: Secondary | ICD-10-CM | POA: Diagnosis not present

## 2020-04-06 DIAGNOSIS — L219 Seborrheic dermatitis, unspecified: Secondary | ICD-10-CM | POA: Diagnosis not present

## 2020-06-01 ENCOUNTER — Ambulatory Visit (INDEPENDENT_AMBULATORY_CARE_PROVIDER_SITE_OTHER): Payer: BC Managed Care – PPO | Admitting: Orthopaedic Surgery

## 2020-06-01 ENCOUNTER — Encounter: Payer: Self-pay | Admitting: Orthopaedic Surgery

## 2020-06-01 ENCOUNTER — Other Ambulatory Visit: Payer: Self-pay

## 2020-06-01 ENCOUNTER — Ambulatory Visit: Payer: Self-pay

## 2020-06-01 DIAGNOSIS — M545 Low back pain, unspecified: Secondary | ICD-10-CM

## 2020-06-01 DIAGNOSIS — Z96642 Presence of left artificial hip joint: Secondary | ICD-10-CM | POA: Diagnosis not present

## 2020-06-01 MED ORDER — TIZANIDINE HCL 4 MG PO TABS: 4.0000 mg | ORAL_TABLET | Freq: Three times a day (TID) | ORAL | 1 refills | Status: DC | PRN

## 2020-06-01 MED ORDER — METHYLPREDNISOLONE 4 MG PO TABS: ORAL_TABLET | ORAL | 0 refills | Status: DC

## 2020-06-01 NOTE — Progress Notes (Signed)
The patient is well-known to me.  He has a history of bilateral hip replacements done at different times.  His left most recent one was done in November 2020.  He has had no issues with his hips at all.  He has been dealing with acute on chronic low back pain just across his lower spine more to the right side.  There is no radicular component.  He has been to the chiropractor once but he was concerned about how actually stretched on his hips given hip replacement surgery.  He is seeing a massage therapist this afternoon.  Again he denies any weakness or radicular symptoms but he does get a catching sensation in his lower back to the right side.  On examination both hips move smoothly and fluidly.  His back is tender in the paraspinal muscles to the right side at the lower lumbar spine but not to the left side he has a negative straight leg raise bilaterally.  An AP pelvis and lateral of his left more recent hip shows a well-seated implants bilaterally without any complicating features.  At this point I would like to try Zanaflex as a muscle relaxant for his back as well as a 6-day steroid taper.  I let him know that if massage therapy does not work for him to let us know because I would set him up for formal outpatient physical therapy for his back.  All questions and concerns were answered and addressed.  Follow-up as otherwise as needed.

## 2020-06-18 IMAGING — DX DG PORTABLE PELVIS
1 series · 1 of 1 positions shown · non-contrast
Comparison: None

CLINICAL DATA: Postop left total hip arthroplasty.

EXAM:
PORTABLE PELVIS 1-2 VIEWS

[pelvis ap]
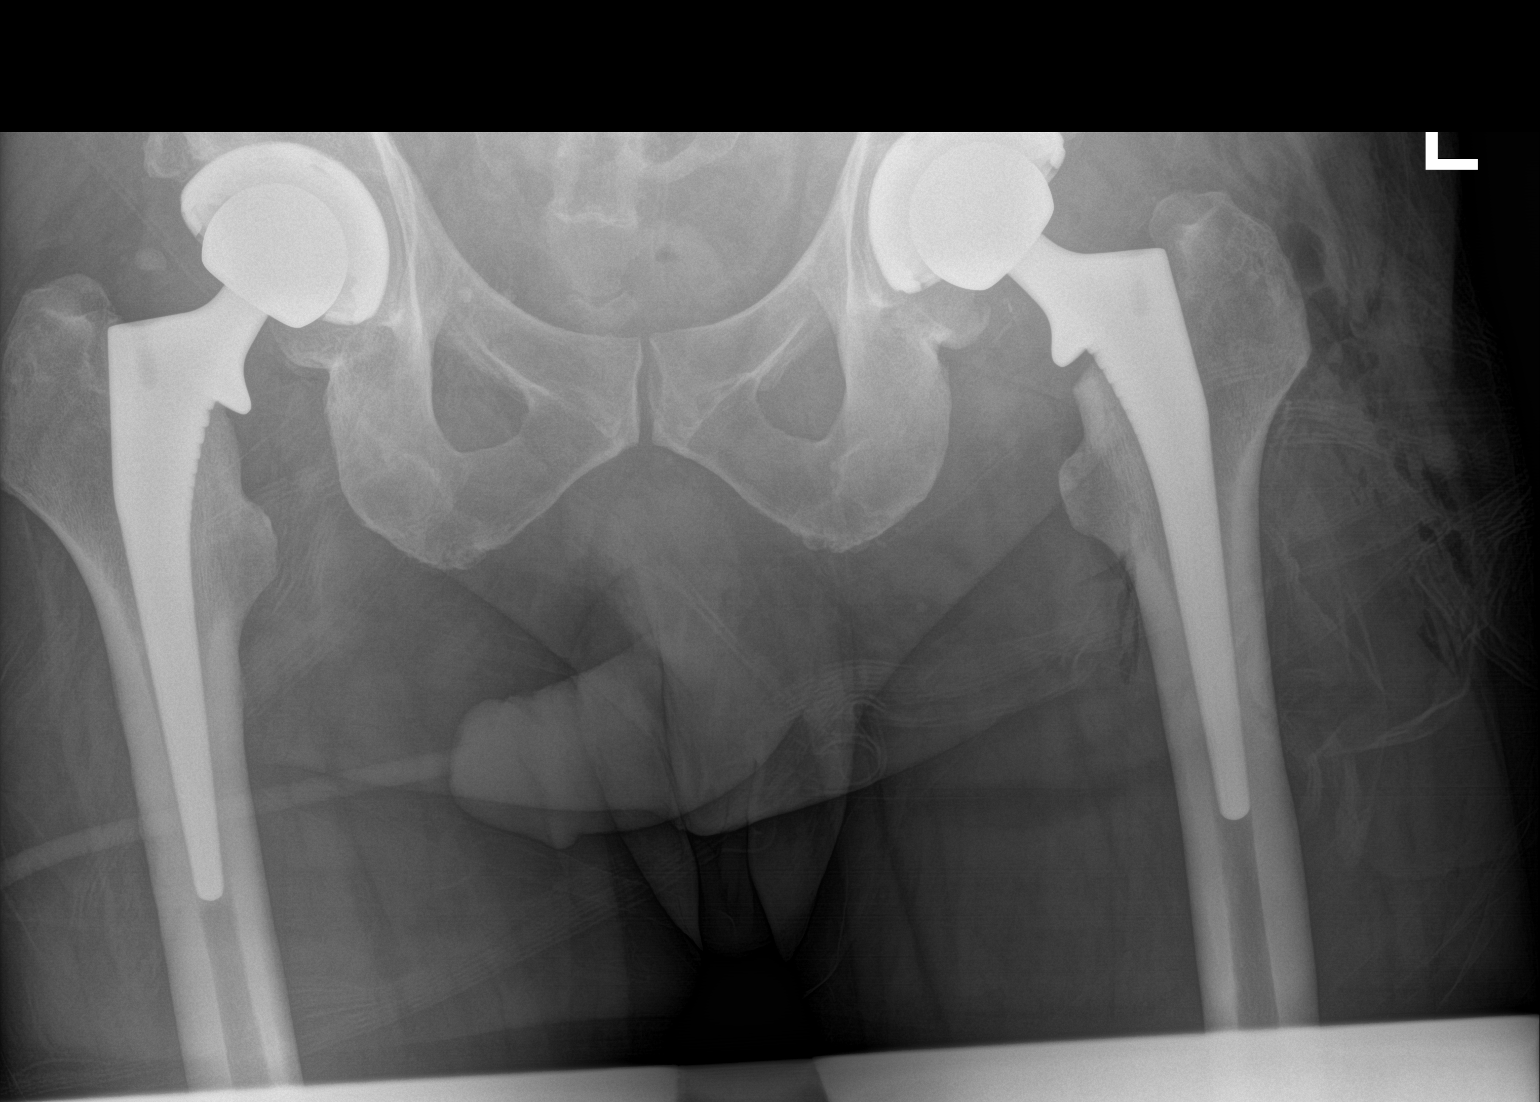

[1 of 1 positions shown; findings below may reference images not displayed]

FINDINGS: Status post left total hip arthroplasty. The hardware components are
in anatomic alignment. There is no periprosthetic fracture or
subluxation identified. Previous right hip arthroplasty is
unchanged. No radio-opaque foreign bodies or soft tissue
calcifications.
IMPRESSION: Status post left total hip arthroplasty.  No complications.

## 2020-06-22 ENCOUNTER — Encounter: Payer: Self-pay | Admitting: Internal Medicine

## 2020-06-22 ENCOUNTER — Other Ambulatory Visit: Payer: Self-pay

## 2020-06-22 ENCOUNTER — Ambulatory Visit (INDEPENDENT_AMBULATORY_CARE_PROVIDER_SITE_OTHER): Payer: BC Managed Care – PPO | Admitting: Internal Medicine

## 2020-06-22 ENCOUNTER — Telehealth: Payer: Self-pay | Admitting: Internal Medicine

## 2020-06-22 VITALS — BP 126/82 | Temp 98.1°F | Ht 74.0 in | Wt 223.0 lb

## 2020-06-22 DIAGNOSIS — Z114 Encounter for screening for human immunodeficiency virus [HIV]: Secondary | ICD-10-CM

## 2020-06-22 DIAGNOSIS — Z6828 Body mass index (BMI) 28.0-28.9, adult: Secondary | ICD-10-CM

## 2020-06-22 DIAGNOSIS — Z13220 Encounter for screening for lipoid disorders: Secondary | ICD-10-CM | POA: Diagnosis not present

## 2020-06-22 DIAGNOSIS — R252 Cramp and spasm: Secondary | ICD-10-CM

## 2020-06-22 DIAGNOSIS — G8929 Other chronic pain: Secondary | ICD-10-CM

## 2020-06-22 DIAGNOSIS — Z1159 Encounter for screening for other viral diseases: Secondary | ICD-10-CM | POA: Diagnosis not present

## 2020-06-22 DIAGNOSIS — M545 Low back pain, unspecified: Secondary | ICD-10-CM

## 2020-06-22 DIAGNOSIS — E663 Overweight: Secondary | ICD-10-CM

## 2020-06-22 NOTE — Progress Notes (Signed)
Provider:  Rexene Edison. Mariea Clonts, D.O., C.M.D. Location:   [place]   Place of Service:    clinic  Previous PCP: did not have  Patient Care Team: Gayland Curry, DO as PCP - General (Geriatric Medicine) Mcarthur Rossetti, MD as Consulting Physician (Orthopedic Surgery) Jari Pigg, MD as Consulting Physician (Dermatology)  Extended Emergency Contact Information Primary Emergency Contact: [facility] Address: [address]          [place], [place] [address] [name] of [place] Phone: [phone] Mobile Phone: [phone] Relation: Spouse  Code Status: full code Goals of Care: Advanced Directive information Advanced Directives 06/22/2020  Does Patient Have a Medical Advance Directive? Yes  Type of Paramedic of [place];Living will  Does patient want to make changes to medical advance directive? No - Patient declined  Copy of [place] in Chart? No - copy requested   Chief Complaint  Patient presents with  . Establish Care    New patient to establish care     HPI: Patient is a 64 y.o. male seen today to establish with [facility].  His wife is Orvilla Fus who recently established here, as well.    He has a h/o left and right total hip replacement and low back pain for which he has seen Dr. Zollie Beckers.  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    He has a list of concerns.    1.  He weighs a little over 220.  He's developed some fat in his abdomen that developed over the past decade.  Wonders what he can do to lose some weight.  Suggested 10-20 lbs loss.  He eats very healthily--not much red meat at all, mostly chicken, fish, veggies, grains, not high fat.  Do not have dessert in the house.  Juice and gatorade are only sugar sources and protein bars.  Packs lunch of sandwich, fruit, maybe some chips, nuts, pickle.    2.  He's having more muscle cramps, charlie horses especially this summer.  He works out  in the heat and it's harder at 28.  He tries to stay hydrated.  He has begun taking some mag, ca, vitamin D and eating a banana daily.  Drinking gatorade as well as water.  When he gets home in the evening.  He has to be careful not to straighten his leg too quickly, may even get cramps in his legs.  He will do some side to side stretching and the glut stretch in different configurations.  Less concientious about leg stretches.  6/28 Was on prednisone and xanaflex with his low back.  Had an injury in his 80s in his lower back.   Has been a lot better since the episode in June.  He has a neighbor who is a massage therapist and he saw her on a Monday and it was grabbing like crazy on Tuesday--was jabbing him when walking.  She suggested an accupuncturist.  He saw Dr. Rene Kocher which worked Oceanographer.    They are starting to bring in some younger volunteers back in.  Many of the volunteers they have are older than he.    Sleeping better--sometimes just fine.  Other times, he will be awake for an hour and a half in the middle of the night.  Sometimes stressing about something, but not always.  Not worsening.    cscope 2013--totally normal.  Flu shot 2010--the only year he got the flu was after that.  Had the high fever, aches, etc.  Do recommend flu shot.    Had covid vaccines from [facility] with fema.    Thinks his tetanus was 08/20/09.  Needs booster.    Recommended shingrix at his convienience and when 28 prevnar, then pneumovax.    Talked about regular nsaid use--had been doing 2-3 advil per day pre-hip replacements and for his back, but much less.  Discussed tylenol safer.     Gets skin checks with Dr. Foster Simpson 6 mos.    Has not seen optometrist in a long time--no recent eye exam.  Suggested he check in with an eye doctor.   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 Dripps lap and apparently he had hep A.  He  had upset stomach, fever, malaise and hep A.    Past Medical History:  Diagnosis Date  . Anemia    LATE TEENS EARLY 20S   . Arthritis    RIGHT HIP   . Basal cell carcinoma    MULTIPLE OVER THE YEARS  . Hepatitis    HEP A IN THE EARLY 80s , TREATED AND RESOLVED   . Tinnitus    Past Surgical History:  Procedure Laterality Date  . COLONOSCOPY    . TONSILLECTOMY     CHILDHOOD   . 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 TOTAL HIP ARTHROPLASTY Left 10/18/2019   Procedure: LEFT TOTAL HIP ARTHROPLASTY ANTERIOR APPROACH;  Surgeon: Mcarthur Rossetti, MD;  Location: [facility];  Service: Orthopedics;  Laterality: Left;    Social History   Socioeconomic History  . Marital status: Married    Spouse name: Not on file  . Number of children: Not on file  . Years of education: Not on file  . Highest education level: Not on file  Occupational History  . Occupation: Games developer     Comment: [employer]   Tobacco Use  . Smoking status: Former Smoker    Packs/day: 1.00    Years: 20.00    Pack years: 20.00    Types: Cigarettes    Quit date: 2000    Years since quitting: 21.5  . Smokeless tobacco: Never Used  Substance and Sexual Activity  . Alcohol use: Yes    Alcohol/week: 1.0 - 2.0 standard drink    Types: 1 - 2 Standard drinks or equivalent per week    Comment: OCC  . Drug use: Not Currently    Types: Marijuana    Comment: YEARS   . Sexual activity: Not on file  Other Topics Concern  . Not on file  Social History Narrative   Diet:       Do you drink/ eat things with caffeine?  Coffee       Marital status:    Married                           What year were you married ?  1982      Do you live in a house, apartment,assistred living, condo, trailer, etc.)?  House      Is it one or more stories?   1       How many persons live in your home ?  2      Do you have any pets in your home ?(please list)   None      Highest Level of education completed:  B.S.      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                              Type & how often       ADVANCED DIRECTIVES (Please bring copies)      Do you have a living will?       Do you have a DNR form?     No                  If not, do you want to discuss one?  No      Do you have signed POA?HPOA forms?                 If so, please bring to your appointment      FUNCTIONAL STATUS- To be completed by Spouse / child / Staff       Do you have difficulty bathing or dressing yourself ?  No      Do you have difficulty preparing food or eating ?  No      Do you have difficulty managing your mediation ?  No      Do you have difficulty managing your finances ?   No      Do you have difficulty affording your medication ?   No      Social Determinants of Health   Financial Resource Strain:   . Difficulty of Paying Living Expenses:   Food Insecurity:   . Worried About Charity fundraiser in the Last Year:   . Arboriculturist in the Last Year:   Transportation Needs:   . Film/video editor (Medical):   Marland Kitchen Lack of Transportation (Non-Medical):   Physical Activity:   . Days of Exercise per Week:   . Minutes of Exercise per Session:   Stress:   . Feeling of Stress :   Social Connections:   . Frequency of Communication with Friends and Family:   . Frequency of Social Gatherings with Friends and Family:   . Attends Religious Services:   . Active Member of Clubs or Organizations:   . Attends Archivist Meetings:   Marland Kitchen Marital Status:     reports that he quit smoking about 21 years ago. His smoking use included cigarettes. He has a 20.00 pack-year smoking history. He has never used smokeless tobacco. He reports current alcohol use of about 1.0 - 2.0 standard drink of alcohol per week. He reports  previous drug use. Drug: Marijuana.  Functional Status Survey:    Family History  Problem Relation Age of Onset  . Heart disease Father 20  . Lung cancer Father   . Breast cancer Sister 62    Health Maintenance  Topic Date Due  . Hepatitis C Screening  Never done  . HIV Screening  Never done  . INFLUENZA VACCINE  07/05/2020  . COLONOSCOPY  01/03/2021  . TETANUS/TDAP  08/14/2025  . COVID-19 Vaccine  Completed    Allergies  Allergen Reactions  . Other Shortness Of Breath and Swelling    Pan alba,   Swelling in throat and tounge    Outpatient Encounter Medications as of  06/22/2020  Medication Sig  . cetirizine (ZYRTEC) 10 MG tablet Take 10 mg by mouth daily.   Marland Kitchen ibuprofen (ADVIL) 200 MG tablet Take 200 mg by mouth. Takes 2-3 per day  . Multiple Vitamin (MULTIVITAMIN ADULT PO) Take 1 tablet by mouth daily.  . [DISCONTINUED] methylPREDNISolone (MEDROL) 4 MG tablet Medrol dose pack. Take as instructed  . [DISCONTINUED] tiZANidine (ZANAFLEX) 4 MG tablet Take 1 tablet (4 mg total) by mouth every 8 (eight) hours as needed for muscle spasms.   No facility-administered encounter medications on file as of 06/22/2020.    Review of Systems  Constitutional: Negative for chills, fever and malaise/fatigue.  HENT: Negative for congestion, hearing loss and sore throat.   Eyes: Negative for blurred vision.       Reading glasses, no recent eye exam so recommended one as he ages to check for MD and glaucoma  Respiratory: Negative for cough and shortness of breath.   Cardiovascular: Negative for chest pain, palpitations and leg swelling.  Gastrointestinal: Negative for abdominal pain, blood in stool, constipation, diarrhea and melena.  Genitourinary: Negative for dysuria, frequency and urgency.  Musculoskeletal: Positive for back pain and myalgias. Negative for falls and joint pain.  Skin: Negative for itching and rash.       Fair skin, works outside, sees Dr. Jari Pigg twice a year   Neurological: Negative for dizziness, tingling, loss of consciousness and weakness.  Endo/Heme/Allergies: Does not bruise/bleed easily.  Psychiatric/Behavioral: Negative for depression and memory loss. The patient is not nervous/anxious and does not have insomnia.        Interrupted sleep at times--"a hole" in the middle of the night    Vitals:   06/22/20 1328  BP: 126/82  Temp: 98.1 F (36.7 C)  TempSrc: Temporal  SpO2: 97%  Weight: 223 lb (101.2 kg)  Height: 6\' 2"  (1.88 m)   Body mass index is 28.63 kg/m. Physical Exam Vitals reviewed.  Constitutional:      General: He is not in acute distress.    Appearance: Normal appearance. He is not toxic-appearing.  HENT:     Head: Normocephalic and atraumatic.     Right Ear: Tympanic membrane, ear canal and external ear normal.     Left Ear: Tympanic membrane, ear canal and external ear normal.     Nose:     Comments: Nose and mouth deferred with covid masking Eyes:     Extraocular Movements: Extraocular movements intact.     Pupils: Pupils are equal, round, and reactive to light.     Comments: Reading glasses  Cardiovascular:     Rate and Rhythm: Normal rate and regular rhythm.     Pulses: Normal pulses.     Heart sounds: Normal heart sounds. No murmur heard.  No friction rub. No gallop.   Pulmonary:     Effort: Pulmonary effort is normal.     Breath sounds: Normal breath sounds. No wheezing, rhonchi or rales.  Abdominal:     General: Bowel sounds are normal. There is no distension.     Palpations: Abdomen is soft. There is no mass.     Tenderness: There is no abdominal tenderness. There is no guarding or rebound.  Musculoskeletal:        General: No tenderness. Normal range of motion.     Cervical back: Neck supple. No rigidity.     Right lower leg: No edema.     Left lower leg: No edema.     Comments: No active  back symptoms during appt but wants referrals available when needed  Lymphadenopathy:     Cervical: No  cervical adenopathy.  Skin:    General: Skin is warm and dry.     Capillary Refill: Capillary refill takes less than 2 seconds.  Neurological:     General: No focal deficit present.     Mental Status: He is alert and oriented to person, place, and time.     Cranial Nerves: No cranial nerve deficit.     Sensory: No sensory deficit.     Motor: No weakness.     Coordination: Coordination normal.     Gait: Gait normal.     Deep Tendon Reflexes: Reflexes normal.  Psychiatric:        Mood and Affect: Mood normal.        Behavior: Behavior normal.        Thought Content: Thought content normal.        Judgment: Judgment normal.     Labs reviewed: Basic Metabolic Panel: Recent Labs    10/19/19 0256  NA 132*  K 3.7  CL 103  CO2 24  GLUCOSE 129*  BUN 18  CREATININE 1.17  CALCIUM 7.6*   CBC: Recent Labs    10/15/19 1501 10/19/19 0256  WBC 7.3 7.6  HGB 14.4 10.9*  HCT 44.8 33.5*  MCV 94.7 94.6  PLT 272 205    Imaging and Procedures noted on new patient packet: reviewed notes in epic, also  Assessment/Plan 1. Chronic midline low back pain without sciatica - requests referrals to nontraditional practices: - Ambulatory referral to Chiropractic, massage and accupuncture d/t insurance coverage with them -[place] [facility] therapy -cont regular stretches, hydration, electrolyte repletion, and avoidance of heaviest work   2. Bilateral leg cramps -replete electrolytes working outside, hydrate, stretch, do some walking and stretching on off days, as well - COMPLETE METABOLIC PANEL WITH GFR - Ambulatory referral to Chiropractic  3. Screening, lipid -no recent checks so will arrange fasting lab  4. Encounter for hepatitis C screening test for low risk patient -never done before so will do with next lab  5. Encounter for screening for HIV -agrees to check with next lab  6 and 7.  Overweight with bmi 28--counseled on 10-20 lb  weight loss being ideal -discussed need for reserves with age in case of severe illness -suggested making a walk a routine thing on off days for calorie burning, diet sounds fairly healthy with few areas for improvement considering his active lifestyle  Labs/tests ordered:   Orders Placed This Encounter  Procedures  . COMPLETE METABOLIC PANEL WITH GFR  . Ambulatory referral to Chiropractic    Referral Priority:   Routine    Referral Type:   Chiropractic    Referral Reason:   Specialty Services Required    Requested Specialty:   Chiropractic Medicine    Number of Visits Requested:   1  massage and accupuncture Recommended shingrix now, then tdap booster also, and finally prevnar and pneumovax at [facility]. Azariah Bonura, D.O. [facility] [address],  [address] Cell Phone (Mon-Fri 8am-5pm):  [phone] On Call:  [phone] & follow prompts after 5pm & weekends Office Phone:  [phone] Office Fax:  [phone]

## 2020-06-22 NOTE — Telephone Encounter (Signed)
Please call to refer for massage per pt request.

## 2020-06-22 NOTE — Patient Instructions (Addendum)
Get your tdap at the pharmacy at your convenience. Also shingrix series can be done at the pharmacy. I suggest a walk on your off days just to help lose the weight you desire of 10-20 lbs. Be sure to stretch regularly. I will put in the referrals you've asked for.   Please schedule your labs in the near future--fasting.   Follow-up in one year for your annual physical.  Fat and Cholesterol Restricted Eating Plan Getting too much fat and cholesterol in your diet may cause health problems. Choosing the right foods helps keep your fat and cholesterol at normal levels. This can keep you from getting certain diseases. Your doctor may recommend an eating plan that includes:  Total fat: ______% or less of total calories a day.  Saturated fat: ______% or less of total calories a day.  Cholesterol: less than _________mg a day.  Fiber: ______g a day. What are tips for following this plan? Meal planning  At meals, divide your plate into four equal parts: ? Fill one-half of your plate with vegetables and green salads. ? Fill one-fourth of your plate with whole grains. ? Fill one-fourth of your plate with low-fat (lean) protein foods.  Eat fish that is high in omega-3 fats at least two times a week. This includes mackerel, tuna, sardines, and salmon.  Eat foods that are high in fiber, such as whole grains, beans, apples, broccoli, carrots, peas, and barley. General tips   Work with your doctor to lose weight if you need to.  Avoid: ? Foods with added sugar. ? Fried foods. ? Foods with partially hydrogenated oils.  Limit alcohol intake to no more than 1 drink a day for nonpregnant women and 2 drinks a day for men. One drink equals 12 oz of beer, 5 oz of wine, or 1 oz of hard liquor. Reading food labels  Check food labels for: ? Trans fats. ? Partially hydrogenated oils. ? Saturated fat (g) in each serving. ? Cholesterol (mg) in each serving. ? Fiber (g) in each serving.  Choose  foods with healthy fats, such as: ? Monounsaturated fats. ? Polyunsaturated fats. ? Omega-3 fats.  Choose grain products that have whole grains. Look for the word "whole" as the first word in the ingredient list. Cooking  Cook foods using low-fat methods. These include baking, boiling, grilling, and broiling.  Eat more home-cooked foods. Eat at restaurants and buffets less often.  Avoid cooking using saturated fats, such as butter, cream, palm oil, palm kernel oil, and coconut oil. Recommended foods  Fruits  All fresh, canned (in natural juice), or frozen fruits. Vegetables  Fresh or frozen vegetables (raw, steamed, roasted, or grilled). Green salads. Grains  Whole grains, such as whole wheat or whole grain breads, crackers, cereals, and pasta. Unsweetened oatmeal, bulgur, barley, quinoa, or [place] rice. Corn or whole wheat flour tortillas. Meats and other protein foods  Ground beef (85% or leaner), grass-fed beef, or beef trimmed of fat. Skinless chicken or [place]. Ground chicken or [place]. Pork trimmed of fat. All fish and seafood. Egg whites. Dried beans, peas, or lentils. Unsalted nuts or seeds. Unsalted canned beans. Nut butters without added sugar or oil. Dairy  Low-fat or nonfat dairy products, such as skim or 1% milk, 2% or reduced-fat cheeses, low-fat and fat-free ricotta or cottage cheese, or plain low-fat and nonfat yogurt. Fats and oils  Tub margarine without trans fats. Light or reduced-fat mayonnaise and salad dressings. Avocado. Olive, canola, sesame, or safflower oils. The items  listed above may not be a complete list of foods and beverages you can eat. Contact a dietitian for more information. Foods to avoid Fruits  Canned fruit in heavy syrup. Fruit in cream or butter sauce. Fried fruit. Vegetables  Vegetables cooked in cheese, cream, or butter sauce. Fried vegetables. Grains  White bread. White pasta. White rice. Cornbread. Bagels, pastries, and  croissants. Crackers and snack foods that contain trans fat and hydrogenated oils. Meats and other protein foods  Fatty cuts of meat. Ribs, chicken wings, bacon, sausage, bologna, salami, chitterlings, fatback, hot dogs, bratwurst, and packaged lunch meats. Liver and organ meats. Whole eggs and egg yolks. Chicken and [place] with skin. Fried meat. Dairy  Whole or 2% milk, cream, half-and-half, and cream cheese. Whole milk cheeses. Whole-fat or sweetened yogurt. Full-fat cheeses. Nondairy creamers and whipped toppings. Processed cheese, cheese spreads, and cheese curds. Beverages  Alcohol. Sugar-sweetened drinks such as sodas, lemonade, and fruit drinks. Fats and oils  Butter, stick margarine, lard, shortening, ghee, or bacon fat. Coconut, palm kernel, and palm oils. Sweets and desserts  Corn syrup, sugars, honey, and molasses. Candy. Jam and jelly. Syrup. Sweetened cereals. Cookies, pies, cakes, donuts, muffins, and ice cream. The items listed above may not be a complete list of foods and beverages you should avoid. Contact a dietitian for more information. Summary  Choosing the right foods helps keep your fat and cholesterol at normal levels. This can keep you from getting certain diseases.  At meals, fill one-half of your plate with vegetables and green salads.  Eat high-fiber foods, like whole grains, beans, apples, carrots, peas, and barley.  Limit added sugar, saturated fats, alcohol, and fried foods. This information is not intended to replace advice given to you by your health care provider. Make sure you discuss any questions you have with your health care provider. Document Revised: 07/25/2018 Document Reviewed: 08/08/2017 Elsevier Patient Education  [place].

## 2020-06-22 NOTE — Telephone Encounter (Signed)
Please call to arrange referral for accupuncture at his request.

## 2020-06-23 DIAGNOSIS — M545 Low back pain, unspecified: Secondary | ICD-10-CM | POA: Insufficient documentation

## 2020-06-23 DIAGNOSIS — Z6828 Body mass index (BMI) 28.0-28.9, adult: Secondary | ICD-10-CM | POA: Insufficient documentation

## 2020-06-23 DIAGNOSIS — E663 Overweight: Secondary | ICD-10-CM | POA: Insufficient documentation

## 2020-06-23 DIAGNOSIS — R252 Cramp and spasm: Secondary | ICD-10-CM | POA: Insufficient documentation

## 2020-06-23 DIAGNOSIS — G8929 Other chronic pain: Secondary | ICD-10-CM | POA: Insufficient documentation

## 2020-06-25 NOTE — Addendum Note (Signed)
Addended by: Gayland Curry on: 06/25/2020 11:33 AM   Modules accepted: Level of Service

## 2020-06-27 NOTE — Addendum Note (Signed)
Addended by: Hollace Kinnier L on: 06/27/2020 10:20 AM   Modules accepted: Orders

## 2020-06-29 ENCOUNTER — Other Ambulatory Visit: Payer: BC Managed Care – PPO

## 2020-06-29 ENCOUNTER — Other Ambulatory Visit: Payer: Self-pay

## 2020-06-29 DIAGNOSIS — Z13220 Encounter for screening for lipoid disorders: Secondary | ICD-10-CM

## 2020-06-29 DIAGNOSIS — M545 Low back pain, unspecified: Secondary | ICD-10-CM

## 2020-06-29 DIAGNOSIS — Z1159 Encounter for screening for other viral diseases: Secondary | ICD-10-CM | POA: Diagnosis not present

## 2020-06-29 DIAGNOSIS — R252 Cramp and spasm: Secondary | ICD-10-CM | POA: Diagnosis not present

## 2020-06-29 DIAGNOSIS — Z114 Encounter for screening for human immunodeficiency virus [HIV]: Secondary | ICD-10-CM

## 2020-06-30 LAB — HEPATITIS C ANTIBODY
Hepatitis C Ab: NONREACTIVE
SIGNAL TO CUT-OFF: 0.01 (ref <1.00)

## 2020-06-30 LAB — CBC WITH DIFFERENTIAL/PLATELET
Absolute Monocytes: 361 cells/uL (ref 200–950)
Basophils Absolute: 62 cells/uL (ref 0–200)
Basophils Relative: 1.4 %
Eosinophils Absolute: 70 cells/uL (ref 15–500)
Eosinophils Relative: 1.6 %
HCT: 44.7 % (ref 38.5–50.0)
Hemoglobin: 14.9 g/dL (ref 13.2–17.1)
Lymphs Abs: 1144 cells/uL (ref 850–3900)
MCH: 31 pg (ref 27.0–33.0)
MCHC: 33.3 g/dL (ref 32.0–36.0)
MCV: 92.9 fL (ref 80.0–100.0)
MPV: 9.4 fL (ref 7.5–12.5)
Monocytes Relative: 8.2 %
Neutro Abs: 2763 cells/uL (ref 1500–7800)
Neutrophils Relative %: 62.8 %
Platelets: 302 10*3/uL (ref 140–400)
RBC: 4.81 10*6/uL (ref 4.20–5.80)
RDW: 12.3 % (ref 11.0–15.0)
Total Lymphocyte: 26 %
WBC: 4.4 10*3/uL (ref 3.8–10.8)

## 2020-06-30 LAB — COMPREHENSIVE METABOLIC PANEL
AG Ratio: 1.7 (calc) (ref 1.0–2.5)
ALT: 16 U/L (ref 9–46)
AST: 19 U/L (ref 10–35)
Albumin: 3.8 g/dL (ref 3.6–5.1)
Alkaline phosphatase (APISO): 67 U/L (ref 35–144)
BUN: 17 mg/dL (ref 7–25)
CO2: 22 mmol/L (ref 20–32)
Calcium: 8.5 mg/dL — ABNORMAL LOW (ref 8.6–10.3)
Chloride: 106 mmol/L (ref 98–110)
Creat: 1.01 mg/dL (ref 0.70–1.25)
Globulin: 2.2 g/dL (calc) (ref 1.9–3.7)
Glucose, Bld: 115 mg/dL — ABNORMAL HIGH (ref 65–99)
Potassium: 4.5 mmol/L (ref 3.5–5.3)
Sodium: 137 mmol/L (ref 135–146)
Total Bilirubin: 0.7 mg/dL (ref 0.2–1.2)
Total Protein: 6 g/dL — ABNORMAL LOW (ref 6.1–8.1)

## 2020-06-30 LAB — LIPID PANEL
Cholesterol: 189 mg/dL (ref <200)
HDL: 44 mg/dL (ref >=40)
LDL Cholesterol (Calc): 127 mg/dL (calc) — ABNORMAL HIGH
Non-HDL Cholesterol (Calc): 145 mg/dL (calc) — ABNORMAL HIGH (ref <130)
Total CHOL/HDL Ratio: 4.3 (calc) (ref <5.0)
Triglycerides: 84 mg/dL (ref <150)

## 2020-06-30 LAB — HIV ANTIBODY (ROUTINE TESTING W REFLEX): HIV 1&2 Ab, 4th Generation: NONREACTIVE

## 2020-06-30 NOTE — Progress Notes (Signed)
HIV and hepatitis C screens were both negative.

## 2020-10-01 ENCOUNTER — Encounter: Payer: Self-pay | Admitting: Internal Medicine

## 2020-10-01 ENCOUNTER — Ambulatory Visit (INDEPENDENT_AMBULATORY_CARE_PROVIDER_SITE_OTHER): Payer: BC Managed Care – PPO | Admitting: Internal Medicine

## 2020-10-01 ENCOUNTER — Other Ambulatory Visit: Payer: Self-pay

## 2020-10-01 VITALS — BP 128/78 | HR 74 | Temp 97.8°F | Ht 74.0 in | Wt 222.4 lb

## 2020-10-01 DIAGNOSIS — L84 Corns and callosities: Secondary | ICD-10-CM | POA: Diagnosis not present

## 2020-10-01 NOTE — Patient Instructions (Signed)
We recommend you soak your feet with epsom salts or warm water for 20 mins, then apply vaseline or aquaphor to help the calluses. Try a spacer between your great toe and second toe to prevent further skin breakdown and irritation

## 2020-10-01 NOTE — Progress Notes (Signed)
Location:  [facility] clinic Provider: Lenae Wherley L. Mariea Clonts, D.O., C.M.D.  Goals of Care:  Advanced Directives 10/01/2020  Does Patient Have a Medical Advance Directive? Yes  Type of Paramedic of [place];Living will  Does patient want to make changes to medical advance directive? No - Patient declined  Copy of [place] in Chart? Yes - validated most recent copy scanned in chart (See row information)     Chief Complaint  Patient presents with  . Acute Visit    Big toe Callus x 2 months but really bad this week.     HPI: Patient is a 64 y.o. male seen today for an acute visit for callus on right second toe rubbing against great toe causing pain near the nailbed with some swelling starting.  He also has callusing of his medial great toes bilaterally and heels.  Past Medical History:  Diagnosis Date  . Anemia    LATE TEENS EARLY 20S   . Arthritis    RIGHT HIP   . Basal cell carcinoma    MULTIPLE OVER THE YEARS  . Hepatitis    HEP A IN THE EARLY 80s , TREATED AND RESOLVED   . Tinnitus     Past Surgical History:  Procedure Laterality Date  . COLONOSCOPY    . TONSILLECTOMY     CHILDHOOD   . 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 TOTAL HIP ARTHROPLASTY Left 10/18/2019   Procedure: LEFT TOTAL HIP ARTHROPLASTY ANTERIOR APPROACH;  Surgeon: Mcarthur Rossetti, MD;  Location: [facility];  Service: Orthopedics;  Laterality: Left;    Allergies  Allergen Reactions  . Other Shortness Of Breath and Swelling    Pan alba,   Swelling in throat and tounge    Outpatient Encounter Medications as of 10/01/2020  Medication Sig  . cetirizine (ZYRTEC) 10 MG tablet Take 10 mg by mouth daily.   Marland Kitchen ibuprofen (ADVIL) 200 MG tablet Take 200 mg by mouth. Takes 2-3 per day  . Multiple Vitamin  (MULTIVITAMIN ADULT PO) Take 1 tablet by mouth daily.   No facility-administered encounter medications on file as of 10/01/2020.    Review of Systems:  Review of Systems  Constitutional: Negative for chills and fever.  Skin:       calluses    Health Maintenance  Topic Date Due  . INFLUENZA VACCINE  Never done  . COLONOSCOPY  01/03/2021  . TETANUS/TDAP  08/14/2025  . COVID-19 Vaccine  Completed  . Hepatitis C Screening  Completed  . HIV Screening  Completed    Physical Exam: Vitals:   10/01/20 1536  Weight: 222 lb 6.4 oz (100.9 kg)  Height: 6\' 2"  (1.88 m)   Body mass index is 28.55 kg/m. Physical Exam Vitals reviewed.  Constitutional:      General: He is not in acute distress.    Appearance: Normal appearance. He is not toxic-appearing.  Eyes:     Comments: glasses  Pulmonary:     Effort: Pulmonary effort is normal.  Skin:    Comments: Calluses as described in hpi  Neurological:     General: No focal deficit present.     Mental Status: He is alert and oriented to person, place, and time.  Psychiatric:        Mood and Affect: Mood normal.  Behavior: Behavior normal.     Labs reviewed: Basic Metabolic Panel: Recent Labs    10/19/19 0256 06/29/20 0942  NA 132* 137  K 3.7 4.5  CL 103 106  CO2 24 22  GLUCOSE 129* 115*  BUN 18 17  CREATININE 1.17 1.01  CALCIUM 7.6* 8.5*   Liver Function Tests: Recent Labs    06/29/20 0942  AST 19  ALT 16  BILITOT 0.7  PROT 6.0*   No results for input(s): LIPASE, AMYLASE in the last 8760 hours. No results for input(s): AMMONIA in the last 8760 hours. CBC: Recent Labs    10/15/19 1501 10/19/19 0256 06/29/20 0942  WBC 7.3 7.6 4.4  NEUTROABS  --   --  2,763  HGB 14.4 10.9* 14.9  HCT 44.8 33.5* 44.7  MCV 94.7 94.6 92.9  PLT 272 205 302   Lipid Panel: Recent Labs    06/29/20 0942  CHOL 189  HDL 44  LDLCALC 127*  TRIG 84  CHOLHDL 4.3    Assessment/Plan 1. Pre-ulcerative calluses -We  recommend you soak your feet with epsom salts or warm water for 20 mins, then apply vaseline or aquaphor to help the calluses. Try a spacer between your great toe and second toe to prevent further skin breakdown and irritation  Labs/tests ordered:  No new added Next appt:  Keep CPE with fasting labs before in July  Kele Withem L. Lanny Lipkin, D.O. [facility] [address], [place] [address] Cell Phone (Mon-Fri 8am-5pm):  [phone] On Call:  [phone] & follow prompts after 5pm & weekends Office Phone:  [phone] Office Fax:  [phone]

## 2020-10-12 DIAGNOSIS — Z85828 Personal history of other malignant neoplasm of skin: Secondary | ICD-10-CM | POA: Diagnosis not present

## 2020-10-12 DIAGNOSIS — L578 Other skin changes due to chronic exposure to nonionizing radiation: Secondary | ICD-10-CM | POA: Diagnosis not present

## 2020-10-12 DIAGNOSIS — L821 Other seborrheic keratosis: Secondary | ICD-10-CM | POA: Diagnosis not present

## 2020-10-12 DIAGNOSIS — L570 Actinic keratosis: Secondary | ICD-10-CM | POA: Diagnosis not present

## 2020-10-12 DIAGNOSIS — Z86018 Personal history of other benign neoplasm: Secondary | ICD-10-CM | POA: Diagnosis not present

## 2020-12-09 ENCOUNTER — Other Ambulatory Visit: Payer: BC Managed Care – PPO

## 2020-12-09 DIAGNOSIS — Z20822 Contact with and (suspected) exposure to covid-19: Secondary | ICD-10-CM

## 2020-12-11 LAB — SARS-COV-2, NAA 2 DAY TAT

## 2020-12-11 LAB — NOVEL CORONAVIRUS, NAA: SARS-CoV-2, NAA: NOT DETECTED

## 2020-12-24 DIAGNOSIS — Z1152 Encounter for screening for COVID-19: Secondary | ICD-10-CM | POA: Diagnosis not present

## 2021-01-25 ENCOUNTER — Encounter: Payer: Self-pay | Admitting: Internal Medicine

## 2021-03-23 ENCOUNTER — Encounter: Payer: Self-pay | Admitting: Family

## 2021-03-23 ENCOUNTER — Ambulatory Visit (INDEPENDENT_AMBULATORY_CARE_PROVIDER_SITE_OTHER): Payer: 59 | Admitting: Family

## 2021-03-23 ENCOUNTER — Other Ambulatory Visit: Payer: Self-pay

## 2021-03-23 VITALS — BP 124/90 | HR 61 | Temp 97.2°F | Ht 74.0 in | Wt 234.2 lb

## 2021-03-23 DIAGNOSIS — R059 Cough, unspecified: Secondary | ICD-10-CM

## 2021-03-23 DIAGNOSIS — R0981 Nasal congestion: Secondary | ICD-10-CM

## 2021-03-23 MED ORDER — FLUTICASONE PROPIONATE 50 MCG/ACT NA SUSP: 2.0000 | Freq: Every day | NASAL | 2 refills | Status: DC

## 2021-03-23 MED ORDER — AZITHROMYCIN 250 MG PO TABS: ORAL_TABLET | ORAL | 0 refills | Status: AC

## 2021-03-23 NOTE — Patient Instructions (Signed)
-   continue on Tylenol  - Notify provider if symptoms worsen or fail to improve  - Increase water to 6-8 glasses daily

## 2021-03-23 NOTE — Progress Notes (Signed)
Provider: Marlowe Sax FNP-C  Audre Cenci, Nelda Bucks, NP  Patient Care Team: Lazer Wollard, Nelda Bucks, NP as PCP - General (Family Medicine) Mcarthur Rossetti, MD as Consulting Physician (Orthopedic Surgery) Jari Pigg, MD as Consulting Physician (Dermatology)  Extended Emergency Contact Information Primary Emergency Contact: [facility] Address: [address]          [place], [place] [address] [name] Phone: [phone] Mobile Phone: [phone] Relation: Spouse  Code Status:  Full Code  Goals of care: Advanced Directive information Advanced Directives 10/01/2020  Does Patient Have a Medical Advance Directive? Yes  Type of Paramedic of [place];Living will  Does patient want to make changes to medical advance directive? No - Patient declined  Copy of [place] in Chart? Yes - validated most recent copy scanned in chart (See row information)     Chief Complaint  Patient presents with  . Acute Visit    Head cold since for 5 days now. No sore throat. No fever that he knows of. Seems to be moving into his chest. Nyquil cough is helping a bit.     HPI:  Pt is a 65 y.o. male seen today for an acute visit for evaluation of head cold x 5 days.seems to be moving to chest.Has used Nyquil with some improvement.had nasal congestion.Has had no fever,chills,shorttness of breath or wheezing.Has been drinking a lot juice and water. Has low energy. Did home kit COVID-19 test was negative.No loss of sense taste or smell. Wears a mask when going to the store.    Past Medical History:  Diagnosis Date  . Anemia    LATE TEENS EARLY 20S   . Arthritis    RIGHT HIP   . Basal cell carcinoma    MULTIPLE OVER THE YEARS  . Hepatitis    HEP A IN THE EARLY 80s , TREATED AND RESOLVED   . Tinnitus    Past Surgical History:  Procedure Laterality Date  . COLONOSCOPY    . TONSILLECTOMY     CHILDHOOD   . TOTAL HIP ARTHROPLASTY Right  11/23/2018   Procedure: RIGHT TOTAL HIP ARTHROPLASTY ANTERIOR APPROACH;  Surgeon: Mcarthur Rossetti, MD;  [facility]: [facility];  Service: Orthopedics;  Laterality: Right;  . TOTAL HIP ARTHROPLASTY Left 10/18/2019   Procedure: LEFT TOTAL HIP ARTHROPLASTY ANTERIOR APPROACH;  Surgeon: Mcarthur Rossetti, MD;  [facility]: [facility];  Service: Orthopedics;  Laterality: Left;    Allergies  Allergen Reactions  . Other Shortness Of Breath and Swelling    Pan alba,   Swelling in throat and tounge    Outpatient Encounter Medications as of 03/23/2021  Medication Sig  . Ascorbic Acid (VITAMIN C) 1000 MG tablet Take 1,000 mg by mouth in the morning and at bedtime.  . cetirizine (ZYRTEC) 10 MG tablet Take 10 mg by mouth daily.  Marland Kitchen ibuprofen (ADVIL) 200 MG tablet Take 200 mg by mouth. Takes 2-3 per day  . Multiple Vitamin (MULTIVITAMIN ADULT PO) Take 1 tablet by mouth daily.   No facility-administered encounter medications on file as of 03/23/2021.    Review of Systems  Constitutional: Negative for appetite change, chills, fatigue, fever and unexpected weight change.  HENT: Positive for congestion and rhinorrhea. Negative for dental problem, ear discharge, ear pain, facial swelling, hearing loss, nosebleeds, postnasal drip, sinus pressure, sinus pain, sneezing, sore throat, tinnitus and trouble swallowing.        Used OTC nasal spray   Eyes: Negative for pain,  discharge, redness, itching and visual disturbance.  Respiratory: Positive for cough. Negative for chest tightness, shortness of breath and wheezing.   Cardiovascular: Negative for chest pain, palpitations and leg swelling.  Gastrointestinal: Negative for abdominal distention, abdominal pain, blood in stool, constipation, diarrhea, nausea and vomiting.  Genitourinary: Negative for urgency.  Musculoskeletal: Negative for gait problem and myalgias.  Skin: Negative for color change, pallor, rash and wound.  Neurological: Negative for dizziness,  syncope, speech difficulty, weakness, light-headedness, numbness and headaches.    Immunization History  Administered Date(s) Administered  . PFIZER(Purple Top)SARS-COV-2 Vaccination 03/22/2020, 04/01/2020, 11/04/2020  . Tdap 08/20/2009   Pertinent  Health Maintenance Due  Topic Date Due  . COLONOSCOPY (Pts 45-40yr Insurance coverage will need to be confirmed)  01/03/2021  . INFLUENZA VACCINE  07/05/2021   Fall Risk  03/23/2021 10/01/2020 06/22/2020 11/04/2015  Falls in the past year? 0 0 0 No  Number falls in past yr: 0 0 1 -  Injury with Fall? - 0 0 -   Functional Status Survey:    Vitals:   03/23/21 1214  BP: 124/90  Pulse: 61  Temp: (!) 97.2 F (36.2 C)  SpO2: 95%  Weight: 234 lb 3.2 oz (106.2 kg)  Height: '6\' 2"'  (1.88 m)   Body mass index is 30.07 kg/m. Physical Exam Vitals reviewed.  Constitutional:      General: He is not in acute distress.    Appearance: Normal appearance. He is normal weight. He is not ill-appearing or diaphoretic.  HENT:     Head: Normocephalic.     Right Ear: Tympanic membrane, ear canal and external ear normal. There is no impacted cerumen.     Left Ear: Tympanic membrane, ear canal and external ear normal. There is no impacted cerumen.     Nose: Congestion and rhinorrhea present. No nasal tenderness.     Right Turbinates: Enlarged.     Left Turbinates: Enlarged.     Right Sinus: No maxillary sinus tenderness or frontal sinus tenderness.     Left Sinus: No maxillary sinus tenderness or frontal sinus tenderness.     Mouth/Throat:     Mouth: Mucous membranes are moist.     Pharynx: Oropharynx is clear. No oropharyngeal exudate or posterior oropharyngeal erythema.  Eyes:     General: No scleral icterus.       Right eye: No discharge.        Left eye: No discharge.     Extraocular Movements: Extraocular movements intact.     Conjunctiva/sclera: Conjunctivae normal.     Pupils: Pupils are equal, round, and reactive to light.   Cardiovascular:     Rate and Rhythm: Normal rate and regular rhythm.     Pulses: Normal pulses.     Heart sounds: Normal heart sounds. No murmur heard. No friction rub. No gallop.   Pulmonary:     Effort: Pulmonary effort is normal. No respiratory distress.     Breath sounds: Normal breath sounds. No wheezing, rhonchi or rales.  Chest:     Chest wall: No tenderness.  Abdominal:     General: Bowel sounds are normal. There is no distension.     Palpations: Abdomen is soft. There is no mass.     Tenderness: There is no abdominal tenderness. There is no right CVA tenderness, left CVA tenderness, guarding or rebound.  Musculoskeletal:        General: No swelling. Normal range of motion.     Cervical back: Normal range of motion.  No tenderness.     Right lower leg: No edema.     Left lower leg: No edema.  Lymphadenopathy:     Cervical: No cervical adenopathy.  Skin:    General: Skin is warm and dry.     Coloration: Skin is not pale.     Findings: No bruising, erythema, lesion or rash.  Neurological:     Mental Status: He is alert and oriented to person, place, and time.     Cranial Nerves: No cranial nerve deficit.     Sensory: No sensory deficit.     Motor: No weakness.     Coordination: Coordination normal.     Gait: Gait normal.  Psychiatric:        Mood and Affect: Mood normal.        Speech: Speech normal.        Behavior: Behavior normal.        Thought Content: Thought content normal.        Judgment: Judgment normal.     Labs reviewed: Recent Labs    06/29/20 0942  NA 137  K 4.5  CL 106  CO2 22  GLUCOSE 115*  BUN 17  CREATININE 1.01  CALCIUM 8.5*   Recent Labs    06/29/20 0942  AST 19  ALT 16  BILITOT 0.7  PROT 6.0*   Recent Labs    06/29/20 0942  WBC 4.4  NEUTROABS 2,763  HGB 14.9  HCT 44.7  MCV 92.9  PLT 302   No results found for: TSH No results found for: HGBA1C Lab Results  Component Value Date   CHOL 189 06/29/2020   HDL 44  06/29/2020   LDLCALC 127 (H) 06/29/2020   TRIG 84 06/29/2020   CHOLHDL 4.3 06/29/2020    Significant Diagnostic Results in last 30 days:  No results found.  Assessment/Plan 1. Nasal congestion Afebrile.rhinorrhea with enlarged turbinate.No sinus tenderness noted on exam.  - Flonase as below  - continue on OTC Zyrtec or claritin daily  - Increase water intake to 6-8 glasses daily  - fluticasone (FLONASE) 50 MCG/ACT nasal spray; Place 2 sprays into both nostrils daily.  Dispense: 15.8 mL; Refill: 2 - azithromycin (ZITHROMAX) 250 MG tablet; Take 2 tablets (500 mg total) by mouth daily for 1 day, THEN 1 tablet (250 mg total) daily for 4 days.  Dispense: 6 tablet; Refill: 0 - Notify provider if symptoms worsen or fail to improve   2. Cough Afebrile.bilateral lung Clear.will treat with Z-pak due to worsening symptoms possible related to allergies.  - azithromycin (ZITHROMAX) 250 MG tablet; Take 2 tablets (500 mg total) by mouth daily for 1 day, THEN 1 tablet (250 mg total) daily for 4 days.  Dispense: 6 tablet; Refill: 0  Family/ staff Communication: Reviewed plan of care with patient verbalized understanding.  Labs/tests ordered: None   Next Appointment: As needed if symptoms worsen or fail to improve    Sandrea Hughs, NP

## 2021-06-03 ENCOUNTER — Other Ambulatory Visit: Payer: Self-pay | Admitting: Family

## 2021-06-03 DIAGNOSIS — Z0000 Encounter for general adult medical examination without abnormal findings: Secondary | ICD-10-CM

## 2021-06-21 ENCOUNTER — Other Ambulatory Visit: Payer: BC Managed Care – PPO

## 2021-06-28 ENCOUNTER — Ambulatory Visit: Payer: BC Managed Care – PPO | Admitting: Family

## 2021-06-28 ENCOUNTER — Ambulatory Visit: Payer: BC Managed Care – PPO | Admitting: Internal Medicine

## 2021-07-12 ENCOUNTER — Other Ambulatory Visit: Payer: Self-pay

## 2021-07-12 ENCOUNTER — Other Ambulatory Visit: Payer: 59

## 2021-07-12 DIAGNOSIS — Z0000 Encounter for general adult medical examination without abnormal findings: Secondary | ICD-10-CM

## 2021-07-12 LAB — CBC WITH DIFFERENTIAL/PLATELET
Absolute Monocytes: 435 cells/uL (ref 200–950)
Basophils Absolute: 69 cells/uL (ref 0–200)
Basophils Relative: 1.3 %
Eosinophils Absolute: 58 cells/uL (ref 15–500)
Eosinophils Relative: 1.1 %
HCT: 46.6 % (ref 38.5–50.0)
Hemoglobin: 15.7 g/dL (ref 13.2–17.1)
Lymphs Abs: 1500 cells/uL (ref 850–3900)
MCH: 31.2 pg (ref 27.0–33.0)
MCHC: 33.7 g/dL (ref 32.0–36.0)
MCV: 92.6 fL (ref 80.0–100.0)
MPV: 9.4 fL (ref 7.5–12.5)
Monocytes Relative: 8.2 %
Neutro Abs: 3238 cells/uL (ref 1500–7800)
Neutrophils Relative %: 61.1 %
Platelets: 279 10*3/uL (ref 140–400)
RBC: 5.03 10*6/uL (ref 4.20–5.80)
RDW: 11.9 % (ref 11.0–15.0)
Total Lymphocyte: 28.3 %
WBC: 5.3 10*3/uL (ref 3.8–10.8)

## 2021-07-12 LAB — COMPLETE METABOLIC PANEL WITH GFR
AG Ratio: 1.6 (calc) (ref 1.0–2.5)
ALT: 11 U/L (ref 9–46)
AST: 16 U/L (ref 10–35)
Albumin: 3.9 g/dL (ref 3.6–5.1)
Alkaline phosphatase (APISO): 48 U/L (ref 35–144)
BUN: 18 mg/dL (ref 7–25)
CO2: 27 mmol/L (ref 20–32)
Calcium: 8.9 mg/dL (ref 8.6–10.3)
Chloride: 104 mmol/L (ref 98–110)
Creat: 1.05 mg/dL (ref 0.70–1.35)
Globulin: 2.4 g/dL (calc) (ref 1.9–3.7)
Glucose, Bld: 95 mg/dL (ref 65–99)
Potassium: 4.4 mmol/L (ref 3.5–5.3)
Sodium: 137 mmol/L (ref 135–146)
Total Bilirubin: 0.8 mg/dL (ref 0.2–1.2)
Total Protein: 6.3 g/dL (ref 6.1–8.1)
eGFR: 79 mL/min/{1.73_m2} (ref >=60)

## 2021-07-12 LAB — LIPID PANEL
Cholesterol: 190 mg/dL (ref <200)
HDL: 33 mg/dL — ABNORMAL LOW (ref >=40)
LDL Cholesterol (Calc): 131 mg/dL (calc) — ABNORMAL HIGH
Non-HDL Cholesterol (Calc): 157 mg/dL (calc) — ABNORMAL HIGH (ref <130)
Total CHOL/HDL Ratio: 5.8 (calc) — ABNORMAL HIGH (ref <5.0)
Triglycerides: 144 mg/dL (ref <150)

## 2021-07-12 LAB — TSH: TSH: 2 mIU/L (ref 0.40–4.50)

## 2021-07-14 ENCOUNTER — Encounter: Payer: Self-pay | Admitting: Family

## 2021-07-15 ENCOUNTER — Other Ambulatory Visit: Payer: Self-pay

## 2021-07-15 ENCOUNTER — Ambulatory Visit (INDEPENDENT_AMBULATORY_CARE_PROVIDER_SITE_OTHER): Payer: 59 | Admitting: Family

## 2021-07-15 VITALS — BP 118/70 | HR 54 | Temp 97.8°F | Resp 16 | Ht 74.0 in | Wt 219.4 lb

## 2021-07-15 DIAGNOSIS — Z0000 Encounter for general adult medical examination without abnormal findings: Secondary | ICD-10-CM | POA: Diagnosis not present

## 2021-07-15 DIAGNOSIS — Z1211 Encounter for screening for malignant neoplasm of colon: Secondary | ICD-10-CM | POA: Diagnosis not present

## 2021-07-15 NOTE — Progress Notes (Signed)
Provider: Marlowe Sax FNP-C   Cadence Minton, Nelda Bucks, NP  Patient Care Team: Shakeema Lippman, Nelda Bucks, NP as PCP - General (Family Medicine) Mcarthur Rossetti, MD as Consulting Physician (Orthopedic Surgery) Jari Pigg, MD as Consulting Physician (Dermatology)  Extended Emergency Contact Information Primary Emergency Contact: [facility] Address: [address]          [place], [place] [address] [name] of [place] Phone: [phone] Mobile Phone: [phone] Relation: Spouse  Code Status:  Full Code  Goals of care: Advanced Directive information Advanced Directives 07/14/2021  Does Patient Have a Medical Advance Directive? No  Type of Advance Directive -  Does patient want to make changes to medical advance directive? -  Copy of [place] in Chart? -  Would patient like information on creating a medical advance directive? No - Patient declined     Chief Complaint  Patient presents with   Annual Exam    Physical    HPI:  Pt is a 65 y.o. male seen today for Annual Physical Examination.Has been doing well except for once in a while sneezing possible due to working with [employer] vents dusty areas.coughs  but not regular and without any shortness of breath or wheezing. 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  Recent lab work reviewed and discussed today.Unremarkable labs except LDL 137  Will try dietary modification and exercise for now then recheck if still high will treat.   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    Procedure: RIGHT TOTAL HIP ARTHROPLASTY ANTERIOR APPROACH;  Surgeon: Mcarthur Rossetti, MD;  [facility];  Service: Orthopedics;  Laterality: Right;   TOTAL HIP ARTHROPLASTY Left 10/18/2019   Procedure: LEFT TOTAL HIP ARTHROPLASTY ANTERIOR APPROACH;  Surgeon: Mcarthur Rossetti, MD;  [facility];  Service: Orthopedics;  Laterality: Left;    Allergies  Allergen Reactions   Other Shortness Of Breath and Swelling    Pan alba,   Swelling in throat and tounge    Allergies as of 07/15/2021       Reactions   Other Shortness Of Breath, Swelling   Pan alba,   Swelling in throat and tounge        Medication List        Accurate as of July 15, 2021  3:15 PM. If you have any questions, ask your nurse or doctor.          cetirizine 10 MG tablet Commonly known as: ZYRTEC Take 10 mg by mouth daily.   fluticasone 50 MCG/ACT nasal spray Commonly known as: Flonase Place 2 sprays into both nostrils daily.   ibuprofen 200 MG tablet Commonly known as: ADVIL Take 200 mg by mouth. Takes 2-3 per day   MULTIVITAMIN ADULT PO Take 1 tablet by mouth daily.   vitamin C 1000 MG tablet Take 1,000 mg by mouth in the morning and at bedtime.  Review of Systems  Constitutional:  Negative for appetite change, chills, fatigue, fever and unexpected weight change.  HENT:  Negative for congestion, dental problem, ear discharge, ear pain, facial swelling, hearing loss, nosebleeds, postnasal drip, rhinorrhea, sinus pressure, sinus pain, sneezing, sore throat, tinnitus and trouble swallowing.   Eyes:  Positive for visual disturbance. Negative for pain, discharge, redness and itching.       Wear reading glasses   Respiratory:  Negative for cough, chest tightness, shortness of breath and wheezing.   Cardiovascular:  Negative for chest pain, palpitations and leg swelling.  Gastrointestinal:  Negative for abdominal distention, abdominal pain, blood in stool, constipation,  diarrhea, nausea and vomiting.  Endocrine: Negative for cold intolerance, heat intolerance, polydipsia, polyphagia and polyuria.  Genitourinary:  Negative for difficulty urinating, dysuria, flank pain, frequency and urgency.  Musculoskeletal:  Negative for arthralgias, back pain, gait problem, joint swelling, myalgias, neck pain and neck stiffness.  Skin:  Negative for color change, pallor, rash and wound.  Neurological:  Negative for dizziness, syncope, speech difficulty, weakness, light-headedness, numbness and headaches.  Hematological:  Does not bruise/bleed easily.  Psychiatric/Behavioral:  Negative for agitation, behavioral problems, confusion, hallucinations, self-injury, sleep disturbance and suicidal ideas. The patient is not nervous/anxious.    Immunization History  Administered Date(s) Administered   PFIZER(Purple Top)SARS-COV-2 Vaccination 03/22/2020, 04/01/2020, 11/04/2020   Tdap 08/20/2009   Pertinent  Health Maintenance Due  Topic Date Due   COLONOSCOPY (Pts 45-43yr Insurance coverage will need to be confirmed)  01/03/2021   INFLUENZA VACCINE  07/05/2021   Fall Risk  07/14/2021 03/23/2021 10/01/2020 06/22/2020 11/04/2015  Falls in the past year? 0 0 0 0 No  Number falls in past yr: 0 0 0 1 -  Injury with Fall? 0 - 0 0 -  Risk for fall due to : No Fall Risks - - - -  Follow up Falls evaluation completed - - - -   Functional Status Survey:    Vitals:   07/15/21 1448  BP: 118/70  Pulse: (!) 54  Resp: 16  Temp: 97.8 F (36.6 C)  SpO2: 97%  Weight: 219 lb 6.4 oz (99.5 kg)  Height: '6\' 2"'  (1.88 m)   Body mass index is 28.17 kg/m. Physical Exam Vitals reviewed.  Constitutional:      General: He is not in acute distress.    Appearance: Normal appearance. He is normal weight. He is not ill-appearing or diaphoretic.  HENT:     Head: Normocephalic.     Right Ear: Tympanic membrane, ear canal and external ear normal. There is no impacted cerumen.     Left Ear:  Tympanic membrane, ear canal and external ear normal. There is no impacted cerumen.     Nose: Nose normal. No congestion or rhinorrhea.     Mouth/Throat:     Mouth: Mucous membranes are moist.     Pharynx: Oropharynx is clear. No oropharyngeal exudate or posterior oropharyngeal erythema.  Eyes:     General: No scleral icterus.       Right eye: No discharge.        Left eye: No discharge.     Extraocular Movements: Extraocular movements intact.     Conjunctiva/sclera: Conjunctivae normal.     Pupils: Pupils are equal, round, and reactive to light.  Neck:     Vascular: No carotid bruit.  Cardiovascular:     Rate and Rhythm: Normal rate and regular rhythm.     Pulses: Normal pulses.     Heart  sounds: Normal heart sounds. No murmur heard.   No friction rub. No gallop.  Pulmonary:     Effort: Pulmonary effort is normal. No respiratory distress.     Breath sounds: Normal breath sounds. No wheezing, rhonchi or rales.  Chest:     Chest wall: No tenderness.  Abdominal:     General: Bowel sounds are normal. There is no distension.     Palpations: Abdomen is soft. There is no mass.     Tenderness: There is no abdominal tenderness. There is no right CVA tenderness, left CVA tenderness, guarding or rebound.  Musculoskeletal:        General: No swelling or tenderness. Normal range of motion.     Cervical back: Normal range of motion. No rigidity or tenderness.     Right lower leg: No edema.     Left lower leg: No edema.  Feet:     Right foot:     Skin integrity: Callus present. No skin breakdown.     Left foot:     Skin integrity: Callus present. No skin breakdown.     Comments: Bilateral Bunioin with callus  Lymphadenopathy:     Cervical: No cervical adenopathy.  Skin:    General: Skin is warm and dry.     Coloration: Skin is not pale.     Findings: No bruising, erythema, lesion or rash.  Neurological:     Mental Status: He is alert and oriented to person, place, and time.      Cranial Nerves: No cranial nerve deficit.     Sensory: No sensory deficit.     Motor: No weakness.     Coordination: Coordination normal.     Gait: Gait normal.  Psychiatric:        Mood and Affect: Mood normal.        Speech: Speech normal.        Behavior: Behavior normal.        Thought Content: Thought content normal.        Judgment: Judgment normal.    Labs reviewed: Recent Labs    07/12/21 0814  NA 137  K 4.4  CL 104  CO2 27  GLUCOSE 95  BUN 18  CREATININE 1.05  CALCIUM 8.9   Recent Labs    07/12/21 0814  AST 16  ALT 11  BILITOT 0.8  PROT 6.3   Recent Labs    07/12/21 0814  WBC 5.3  NEUTROABS 3,238  HGB 15.7  HCT 46.6  MCV 92.6  PLT 279   Lab Results  Component Value Date   TSH 2.00 07/12/2021   No results found for: HGBA1C Lab Results  Component Value Date   CHOL 190 07/12/2021   HDL 33 (L) 07/12/2021   LDLCALC 131 (H) 07/12/2021   TRIG 144 07/12/2021   CHOLHDL 5.8 (H) 07/12/2021    Significant Diagnostic Results in last 30 days:  No results found.  Assessment/Plan 1. Routine general medical examination at a health care facility  Up to date with immunization except due for Shingrix and 2nd COVID-19 booster vaccine.Advised to get vaccine at his pharmacy   Medication and labs reviewed patient counselled regarding yearly exam, prevention of dental and periodontal disease, diet, regular sustained exercise for at least 30 minutes x 3 /week,recommended schedule for routine labs. Fall screening. PHQ 9 up dated. - CBC with Differential/Platelet; Future - CMP with eGFR([facility]); Future - TSH; Future - Lipid panel; Future  2. Encounter for screening colonoscopy Asymptomatic -  Ambulatory referral to Gastroenterology  Family/ staff Communication: Reviewed plan of care with patient verbalized understanding  Labs/tests ordered:  - CBC with Differential/Platelet; Future - CMP with eGFR([facility]); Future - TSH; Future - Lipid panel; Future  Next  Appointment : 1 year for Annual Physical Examination.Fasting labs prior to visit.   Sandrea Hughs, NP

## 2021-07-15 NOTE — Patient Instructions (Addendum)
- Please get shingrix vaccine and 2nd COVID-19 booster   Health Maintenance, Male Adopting a healthy lifestyle and getting preventive care are important in promoting health and wellness. Ask your health care provider about: The right schedule for you to have regular tests and exams. Things you can do on your own to prevent diseases and keep yourself healthy. What should I know about diet, weight, and exercise? Eat a healthy diet  Eat a diet that includes plenty of vegetables, fruits, low-fat dairy products, and lean protein. Do not eat a lot of foods that are high in solid fats, added sugars, or sodium.  Maintain a healthy weight Body mass index (BMI) is a measurement that can be used to identify possible weight problems. It estimates body fat based on height and weight. Your health care provider can help determine your BMI and help you achieve or maintain ahealthy weight. Get regular exercise Get regular exercise. This is one of the most important things you can do for your health. Most adults should: Exercise for at least 150 minutes each week. The exercise should increase your heart rate and make you sweat (moderate-intensity exercise). Do strengthening exercises at least twice a week. This is in addition to the moderate-intensity exercise. Spend less time sitting. Even light physical activity can be beneficial. Watch cholesterol and blood lipids Have your blood tested for lipids and cholesterol at 64 years of age, then havethis test every 5 years. You may need to have your cholesterol levels checked more often if: Your lipid or cholesterol levels are high. You are older than 64 years of age. You are at high risk for heart disease. What should I know about cancer screening? Many types of cancers can be detected early and may often be prevented. Depending on your health history and family history, you may need to have cancer screening at various ages. This may include screening  for: Colorectal cancer. Prostate cancer. Skin cancer. Lung cancer. What should I know about heart disease, diabetes, and high blood pressure? Blood pressure and heart disease High blood pressure causes heart disease and increases the risk of stroke. This is more likely to develop in people who have high blood pressure readings, are of African descent, or are overweight. Talk with your health care provider about your target blood pressure readings. Have your blood pressure checked: Every 3-5 years if you are 58-56 years of age. Every year if you are 105 years old or older. If you are between the ages of 35 and 17 and are a current or former smoker, ask your health care provider if you should have a one-time screening for abdominal aortic aneurysm (AAA). Diabetes Have regular diabetes screenings. This checks your fasting blood sugar level. Have the screening done: Once every three years after age 25 if you are at a normal weight and have a low risk for diabetes. More often and at a younger age if you are overweight or have a high risk for diabetes. What should I know about preventing infection? Hepatitis B If you have a higher risk for hepatitis B, you should be screened for this virus. Talk with your health care provider to find out if you are at risk forhepatitis B infection. Hepatitis C Blood testing is recommended for: Everyone born from [birth year] through 1964. Anyone with known risk factors for hepatitis C. Sexually transmitted infections (STIs) You should be screened each year for STIs, including gonorrhea and chlamydia, if: You are sexually active and are younger than 64  years of age. You are older than 64 years of age and your health care provider tells you that you are at risk for this type of infection. Your sexual activity has changed since you were last screened, and you are at increased risk for chlamydia or gonorrhea. Ask your health care provider if you are at risk. Ask your  health care provider about whether you are at high risk for HIV. Your health care provider may recommend a prescription medicine to help prevent HIV infection. If you choose to take medicine to prevent HIV, you should first get tested for HIV. You should then be tested every 3 months for as long as you are taking the medicine. Follow these instructions at home: Lifestyle Do not use any products that contain nicotine or tobacco, such as cigarettes, e-cigarettes, and chewing tobacco. If you need help quitting, ask your health care provider. Do not use street drugs. Do not share needles. Ask your health care provider for help if you need support or information about quitting drugs. Alcohol use Do not drink alcohol if your health care provider tells you not to drink. If you drink alcohol: Limit how much you have to 0-2 drinks a day. Be aware of how much alcohol is in your drink. In the [place] one drink equals one 12 oz bottle of beer (355 mL), one 5 oz glass of wine (148 mL), or one 1 oz glass of hard liquor (44 mL). General instructions Schedule regular health, dental, and eye exams. Stay current with your vaccines. Tell your health care provider if: You often feel depressed. You have ever been abused or do not feel safe at home. Summary Adopting a healthy lifestyle and getting preventive care are important in promoting health and wellness. Follow your health care provider's instructions about healthy diet, exercising, and getting tested or screened for diseases. Follow your health care provider's instructions on monitoring your cholesterol and blood pressure. This information is not intended to replace advice given to you by your health care provider. Make sure you discuss any questions you have with your healthcare provider. Document Revised: 11/14/2018 Document Reviewed: 11/14/2018 Elsevier Patient Education  2022 [place].     Why follow it? Research shows. Those who follow the  Mediterranean diet have a reduced risk of heart disease  The diet is associated with a reduced incidence of Parkinson's and Alzheimer's diseases People following the diet may have longer life expectancies and lower rates of chronic diseases  The Dietary Guidelines for Americans recommends the Mediterranean diet as an eating plan to promote health and prevent disease  What Is the Mediterranean Diet?  Healthy eating plan based on typical foods and recipes of Mediterranean-style cooking The diet is primarily a plant based diet; these foods should make up a majority of meals   Starches - Plant based foods should make up a majority of meals - They are an important sources of vitamins, minerals, energy, antioxidants, and fiber - Choose whole grains, foods high in fiber and minimally processed items  - Typical grain sources include wheat, oats, barley, corn, [place] rice, bulgar, farro, millet, polenta, couscous  - Various types of beans include chickpeas, lentils, fava beans, black beans, white beans   Fruits  Veggies - Large quantities of antioxidant rich fruits & veggies; 6 or more servings  - Vegetables can be eaten raw or lightly drizzled with oil and cooked  - Vegetables common to the traditional Mediterranean Diet include: artichokes, arugula, beets, broccoli, brussel sprouts,  cabbage, carrots, celery, collard greens, cucumbers, eggplant, kale, leeks, lemons, lettuce, mushrooms, okra, onions, peas, peppers, potatoes, pumpkin, radishes, rutabaga, shallots, spinach, sweet potatoes, turnips, zucchini - Fruits common to the Mediterranean Diet include: apples, apricots, avocados, cherries, clementines, dates, figs, grapefruits, grapes, melons, nectarines, oranges, peaches, pears, pomegranates, strawberries, tangerines  Fats - Replace butter and margarine with healthy oils, such as olive oil, canola oil, and tahini  - Limit nuts to no more than a handful a day  - Nuts include walnuts, almonds, pecans,  pistachios, pine nuts  - Limit or avoid candied, honey roasted or heavily salted nuts - Olives are central to the [employer] - can be eaten whole or used in a variety of dishes   Meats Protein - Limiting red meat: no more than a few times a month - When eating red meat: choose lean cuts and keep the portion to the size of deck of cards - Eggs: approx. 0 to 4 times a week  - Fish and lean poultry: at least 2 a week  - Healthy protein sources include, chicken, [place], lean beef, lamb - Increase intake of seafood such as tuna, salmon, trout, mackerel, shrimp, scallops - Avoid or limit high fat processed meats such as sausage and bacon  Dairy - Include moderate amounts of low fat dairy products  - Focus on healthy dairy such as fat free yogurt, skim milk, low or reduced fat cheese - Limit dairy products higher in fat such as whole or 2% milk, cheese, ice cream  Alcohol - Moderate amounts of red wine is ok  - No more than 5 oz daily for women (all ages) and men older than age 11  - No more than 10 oz of wine daily for men younger than 39  Other - Limit sweets and other desserts  - Use herbs and spices instead of salt to flavor foods  - Herbs and spices common to the traditional Mediterranean Diet include: basil, bay leaves, chives, cloves, cumin, fennel, garlic, lavender, marjoram, mint, oregano, parsley, pepper, rosemary, sage, savory, sumac, tarragon, thyme   It's not just a diet, it's a lifestyle:  The Mediterranean diet includes lifestyle factors typical of those in the region  Foods, drinks and meals are best eaten with others and savored Daily physical activity is important for overall good health This could be strenuous exercise like running and aerobics This could also be more leisurely activities such as walking, housework, yard-work, or taking the stairs Moderation is the key; a balanced and healthy diet accommodates most foods and drinks Consider portion sizes and frequency  of consumption of certain foods   Meal Ideas & Options:  Breakfast:  Whole wheat toast or whole wheat English muffins with peanut butter & hard boiled egg Steel cut oats topped with apples & cinnamon and skim milk  Fresh fruit: banana, strawberries, melon, berries, peaches  Smoothies: strawberries, bananas, greek yogurt, peanut butter Low fat greek yogurt with blueberries and granola  Egg white omelet with spinach and mushrooms Breakfast couscous: whole wheat couscous, apricots, skim milk, cranberries  Sandwiches:  Hummus and grilled vegetables (peppers, zucchini, squash) on whole wheat bread   Grilled chicken on whole wheat pita with lettuce, tomatoes, cucumbers or tzatziki  [place] salad on whole wheat bread: tuna salad made with greek yogurt, olives, red peppers, capers, green onions Garlic rosemary lamb pita: lamb sauted with garlic, rosemary, salt & pepper; add lettuce, cucumber, greek yogurt to pita - flavor with lemon juice and black pepper  Seafood:  Mediterranean grilled salmon, seasoned with garlic, basil, parsley, lemon juice and black pepper Shrimp, lemon, and spinach whole-grain pasta salad made with low fat greek yogurt  Seared scallops with lemon orzo  Seared tuna steaks seasoned salt, pepper, coriander topped with tomato mixture of olives, tomatoes, olive oil, minced garlic, parsley, green onions and cappers  Meats:  Herbed greek chicken salad with kalamata olives, cucumber, feta  Red bell peppers stuffed with spinach, bulgur, lean ground beef (or lentils) & topped with feta   Kebabs: skewers of chicken, tomatoes, onions, zucchini, squash  [place] burgers: made with red onions, mint, dill, lemon juice, feta cheese topped with roasted red peppers Vegetarian Cucumber salad: cucumbers, artichoke hearts, celery, red onion, feta cheese, tossed in olive oil & lemon juice  Hummus and whole grain pita points with a greek salad (lettuce, tomato, feta, olives, cucumbers, red  onion) Lentil soup with celery, carrots made with vegetable broth, garlic, salt and pepper  Tabouli salad: parsley, bulgur, mint, scallions, cucumbers, tomato, radishes, lemon juice, olive oil, salt and pepper.

## 2021-11-05 DIAGNOSIS — C44719 Basal cell carcinoma of skin of left lower limb, including hip: Secondary | ICD-10-CM | POA: Diagnosis not present

## 2021-11-05 DIAGNOSIS — I872 Venous insufficiency (chronic) (peripheral): Secondary | ICD-10-CM | POA: Diagnosis not present

## 2022-02-21 DIAGNOSIS — Z23 Encounter for immunization: Secondary | ICD-10-CM | POA: Diagnosis not present

## 2022-02-21 DIAGNOSIS — Z85828 Personal history of other malignant neoplasm of skin: Secondary | ICD-10-CM | POA: Diagnosis not present

## 2022-02-21 DIAGNOSIS — D225 Melanocytic nevi of trunk: Secondary | ICD-10-CM | POA: Diagnosis not present

## 2022-02-21 DIAGNOSIS — L570 Actinic keratosis: Secondary | ICD-10-CM | POA: Diagnosis not present

## 2022-02-21 DIAGNOSIS — C44519 Basal cell carcinoma of skin of other part of trunk: Secondary | ICD-10-CM | POA: Diagnosis not present

## 2022-02-21 DIAGNOSIS — L821 Other seborrheic keratosis: Secondary | ICD-10-CM | POA: Diagnosis not present

## 2022-02-21 DIAGNOSIS — D485 Neoplasm of uncertain behavior of skin: Secondary | ICD-10-CM | POA: Diagnosis not present

## 2022-02-21 DIAGNOSIS — L578 Other skin changes due to chronic exposure to nonionizing radiation: Secondary | ICD-10-CM | POA: Diagnosis not present

## 2022-03-24 DIAGNOSIS — C44519 Basal cell carcinoma of skin of other part of trunk: Secondary | ICD-10-CM | POA: Diagnosis not present

## 2022-04-15 ENCOUNTER — Other Ambulatory Visit: Payer: Self-pay | Admitting: Family

## 2022-04-15 DIAGNOSIS — Z0000 Encounter for general adult medical examination without abnormal findings: Secondary | ICD-10-CM

## 2022-06-14 DIAGNOSIS — L820 Inflamed seborrheic keratosis: Secondary | ICD-10-CM | POA: Diagnosis not present

## 2022-07-25 ENCOUNTER — Other Ambulatory Visit: Payer: PPO

## 2022-07-25 ENCOUNTER — Other Ambulatory Visit: Payer: Self-pay

## 2022-07-25 DIAGNOSIS — R739 Hyperglycemia, unspecified: Secondary | ICD-10-CM | POA: Diagnosis not present

## 2022-07-25 DIAGNOSIS — Z0000 Encounter for general adult medical examination without abnormal findings: Secondary | ICD-10-CM

## 2022-07-25 DIAGNOSIS — Z6828 Body mass index (BMI) 28.0-28.9, adult: Secondary | ICD-10-CM | POA: Diagnosis not present

## 2022-07-25 DIAGNOSIS — E663 Overweight: Secondary | ICD-10-CM | POA: Diagnosis not present

## 2022-07-29 ENCOUNTER — Encounter: Payer: Self-pay | Admitting: Family

## 2022-07-29 ENCOUNTER — Ambulatory Visit (INDEPENDENT_AMBULATORY_CARE_PROVIDER_SITE_OTHER): Payer: PPO | Admitting: Family

## 2022-07-29 VITALS — BP 130/82 | HR 60 | Temp 98.0°F | Resp 16 | Ht 74.0 in | Wt 227.4 lb

## 2022-07-29 DIAGNOSIS — E782 Mixed hyperlipidemia: Secondary | ICD-10-CM

## 2022-07-29 DIAGNOSIS — B351 Tinea unguium: Secondary | ICD-10-CM | POA: Diagnosis not present

## 2022-07-29 DIAGNOSIS — Z23 Encounter for immunization: Secondary | ICD-10-CM | POA: Diagnosis not present

## 2022-07-29 MED ORDER — ATORVASTATIN CALCIUM 10 MG PO TABS: 10.0000 mg | ORAL_TABLET | Freq: Every day | ORAL | 3 refills | Status: DC

## 2022-07-29 NOTE — Patient Instructions (Signed)
Atorvastatin Tablets What is this medication? ATORVASTATIN (a TORE va sta tin) treats high cholesterol and reduces the risk of heart attack and stroke. It works by decreasing bad cholesterol and fats (such as LDL, triglycerides) and increasing good cholesterol (HDL) in your blood. It belongs to a group of medications called statins. Changes to diet and exercise are often combined with this medication. This medicine may be used for other purposes; ask your health care provider or pharmacist if you have questions. COMMON BRAND NAME(S): Lipitor What should I tell my care team before I take this medication? They need to know if you have any of these conditions: Diabetes Frequently drink alcohol Kidney disease Liver disease Muscle cramps, pain Stroke Thyroid disease An unusual or allergic reaction to atorvastatin, other medications, foods, dyes, or preservatives Pregnant or trying to get pregnant Breast-feeding How should I use this medication? Take this medication by mouth. Take it as directed on the label at the same time every day. You can take it with or without food. If it upsets your stomach, take it with food. Keep taking it unless your care team tells you to stop. Do not take this medication with grapefruit juice. Talk to your care team about the use of this medication in children. While it may be prescribed for children as young as 10 years for selected conditions, precautions do apply. Overdosage: If you think you have taken too much of this medicine contact a poison control center or emergency room at once. NOTE: This medicine is only for you. Do not share this medicine with others. What if I miss a dose? If you miss a dose, take it as soon as you can unless it is more than 12 hours late. If it is more than 12 hours late, skip the missed dose. Take the next dose at the normal time. If it is almost time for your next dose, take only that dose. Do not take double or extra doses. What may  interact with this medication? Do not take this medication with any of the following: Dasabuvir; ombitasvir; paritaprevir; ritonavir Lonafarnib Ombitasvir; paritaprevir; ritonavir Posaconazole Red yeast rice This medication may also interact with the following: Alcohol Certain antibiotics like erythromycin and clarithromycin Certain antivirals for HIV or hepatitis Certain medications for cholesterol like fenofibrate, gemfibrozil, and niacin Certain medications for fungal infections like ketoconazole, itraconazole, and voriconazole Colchicine Cyclosporine Digoxin Estrogen or progestin hormones Grapefruit juice Rifampin This list may not describe all possible interactions. Give your health care provider a list of all the medicines, herbs, non-prescription drugs, or dietary supplements you use. Also tell them if you smoke, drink alcohol, or use illegal drugs. Some items may interact with your medicine. What should I watch for while using this medication? Visit your care team for regular checks on your progress. Tell your care team if your symptoms do not start to get better or if they get worse. Your care team may tell you to stop taking this medication if you develop muscle problems. If your muscle problems do not go away after stopping this medication, contact your care team. This medication may increase blood sugar. The risk may be higher in patients who already have diabetes. Ask your care team what you can do to lower your risk of diabetes while on this medication. If you are going to need surgery or other procedure, tell your care team that you are using this medication. Taking this medication is only part of a total heart healthy program. Ask your  care team if there are other changes you can make to improve your overall health. Drinking more than 2 alcoholic drinks every day with this medication can increase the risk of side effects. Talk to your care team if you wish to become  pregnant or think you might be pregnant. This medication can cause serious birth defects. Talk to your care team before breastfeeding. Changes to your treatment plan may be needed. What side effects may I notice from receiving this medication? Side effects that you should report to your care team as soon as possible: Allergic reactions--skin rash, itching, hives, swelling of the face, lips, tongue, or throat High blood sugar (hyperglycemia)--increased thirst or amount of urine, unusual weakness or fatigue, blurry vision Liver injury--right upper belly pain, loss of appetite, nausea, light-colored stool, dark yellow or Vidrine urine, yellowing skin or eyes, unusual weakness or fatigue Muscle injury--unusual weakness or fatigue, muscle pain, dark yellow or [place] urine, decrease in amount of urine Redness, blistering, peeling, or loosening of the skin, including inside the mouth Side effects that usually do not require medical attention (report to your care team if they continue or are bothersome): Diarrhea Nausea Trouble sleeping Upset stomach This list may not describe all possible side effects. Call your doctor for medical advice about side effects. You may report side effects to FDA at 1-800-FDA-1088. Where should I keep my medication? Keep out of the reach of children and pets. Store at room temperature between 20 and 25 degrees C (68 and 77 degrees F). Get rid of any unused medication after the expiration date. To get rid of medications that are no longer needed or have expired: Take the medication to a medication take-back program. Check with your pharmacy or law enforcement to find a location. If you cannot return the medication, check the label or package insert to see if the medication should be thrown out in the garbage or flushed down the toilet. If you are not sure, ask your care team. If it is safe to put it in the trash, take the medication out of the container. Mix the medication with  cat litter, dirt, coffee grounds, or other unwanted substance. Seal the mixture in a bag or container. Put it in the trash. NOTE: This sheet is a summary. It may not cover all possible information. If you have questions about this medicine, talk to your doctor, pharmacist, or health care provider.  2023 Elsevier/Gold Standard (2022-03-24 00:00:00)

## 2022-07-29 NOTE — Progress Notes (Unsigned)
Provider: Marlowe Sax FNP-C   Rashawd Laskaris, Nelda Bucks, NP  Patient Care Team: Rossi Silvestro, Nelda Bucks, NP as PCP - General (Family Medicine) Mcarthur Rossetti, MD as Consulting Physician (Orthopedic Surgery) Jari Pigg, MD as Consulting Physician (Dermatology)  Extended Emergency Contact Information Primary Emergency Contact: [facility] Address: [address]          [place], [place] [address] [name] of [place] Phone: [phone] Mobile Phone: [phone] Relation: Spouse  Code Status:  Full Code  Goals of care: Advanced Directive information    07/29/2022    1:06 PM  Advanced Directives  Does Patient Have a Medical Advance Directive? No  Would patient like information on creating a medical advance directive? No - Patient declined     Chief Complaint  Patient presents with   Medical Management of Chronic Issues    6 month follow up.    Review labs     Review recent labs     Health Maintenance    Discuss the need for Colonoscopy.   Immunizations    Discuss the need for Pne vaccine, Influenza vaccine, and Covid Booster.     HPI:  Pt is a 66 y.o. male seen today for 6 months follow up for medical management of chronic diseases. He denies any acute issues. His recent lab results reviewed and discussed during the visit.Labs unremarkable except high cholesterol as below. 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   Due for influenza and COVID-19 booster vaccine.Aware to get vaccine at the pharmacy.  Also due for PNA vaccine   Hyperlipidemia - recent Chol 211,TRG 154 and LD 147    Past Medical History:  Diagnosis Date   Anemia    LATE TEENS EARLY 20S    Arthritis    RIGHT HIP    Basal cell carcinoma    MULTIPLE OVER THE YEARS   Hepatitis    HEP A IN THE EARLY 80s , TREATED AND RESOLVED    Tinnitus    Past Surgical History:  Procedure  Laterality Date   COLONOSCOPY     TONSILLECTOMY     CHILDHOOD    TOTAL HIP ARTHROPLASTY Right 11/23/2018   Procedure: RIGHT TOTAL HIP ARTHROPLASTY ANTERIOR APPROACH;  Surgeon: Mcarthur Rossetti, MD;  Location: [facility];  Service: Orthopedics;  Laterality: Right;   TOTAL HIP ARTHROPLASTY Left 10/18/2019   Procedure: LEFT TOTAL HIP ARTHROPLASTY ANTERIOR APPROACH;  Surgeon: Mcarthur Rossetti, MD;  Location: [facility];  Service: Orthopedics;  Laterality: Left;    Allergies  Allergen Reactions   Other Shortness Of Breath and Swelling    Pan alba,   Swelling in throat and tounge    Allergies as of 07/29/2022       Reactions   Other Shortness Of Breath, Swelling   Pan alba,   Swelling in throat and tounge        Medication List        Accurate as of July 29, 2022  1:57 PM. If you have any questions, ask your nurse or doctor.          atorvastatin 10 MG tablet Commonly known as: LIPITOR Take 1 tablet (10 mg total) by mouth daily. Started by: Sandrea Hughs, NP   cetirizine 10 MG tablet Commonly known as: ZYRTEC Take 10 mg by mouth as needed for allergies. What changed: Another medication with the  same name was removed. Continue taking this medication, and follow the directions you see here. Changed by: Sandrea Hughs, NP   fluticasone 50 MCG/ACT nasal spray Commonly known as: FLONASE Place 2 sprays into both nostrils as needed for allergies or rhinitis. What changed: Another medication with the same name was removed. Continue taking this medication, and follow the directions you see here. Changed by: Sandrea Hughs, NP   ibuprofen 200 MG tablet Commonly known as: ADVIL Take 200 mg by mouth as needed for moderate pain or mild pain. What changed: Another medication with the same name was removed. Continue taking this medication, and follow the directions you see here. Changed by: Sandrea Hughs, NP   MULTIVITAMIN ADULT PO Take 1 tablet by mouth daily.    vitamin C 1000 MG tablet Take 1,000 mg by mouth in the morning and at bedtime.        Review of Systems  Constitutional:  Negative for appetite change, chills, fatigue, fever and unexpected weight change.  HENT:  Negative for congestion, dental problem, ear discharge, ear pain, facial swelling, hearing loss, nosebleeds, postnasal drip, rhinorrhea, sinus pressure, sinus pain, sneezing, sore throat, tinnitus and trouble swallowing.   Eyes:  Positive for visual disturbance. Negative for pain, discharge, redness and itching.       Wears eye glasses   Respiratory:  Negative for cough, chest tightness, shortness of breath and wheezing.   Cardiovascular:  Negative for chest pain, palpitations and leg swelling.  Gastrointestinal:  Negative for abdominal distention, abdominal pain, blood in stool, constipation, diarrhea, nausea and vomiting.  Endocrine: Negative for cold intolerance, heat intolerance, polydipsia, polyphagia and polyuria.  Genitourinary:  Negative for difficulty urinating, dysuria, flank pain, frequency and urgency.       Voiding 1-2 times   Musculoskeletal:  Negative for arthralgias, back pain, gait problem, joint swelling, myalgias, neck pain and neck stiffness.  Skin:  Negative for color change, pallor, rash and wound.  Neurological:  Negative for dizziness, syncope, speech difficulty, weakness, light-headedness, numbness and headaches.  Hematological:  Does not bruise/bleed easily.  Psychiatric/Behavioral:  Negative for agitation, behavioral problems, confusion, hallucinations, self-injury, sleep disturbance and suicidal ideas. The patient is not nervous/anxious.     Immunization History  Administered Date(s) Administered   PFIZER(Purple Top)SARS-COV-2 Vaccination 03/22/2020, 04/01/2020, 11/04/2020, 08/27/2021   Tdap 08/20/2009   Zoster Recombinat (Shingrix) 12/16/2021, 03/18/2022   Pertinent  Health Maintenance Due  Topic Date Due   COLONOSCOPY (Pts 45-79yr Insurance  coverage will need to be confirmed)  01/03/2021   INFLUENZA VACCINE  07/05/2022      06/22/2020    1:37 PM 10/01/2020    3:38 PM 03/23/2021   12:13 PM 07/14/2021    3:42 PM 07/29/2022    1:06 PM  FCameronin the past year? 0 0 0 0 0  Was there an injury with Fall? 0 0  0 0  Fall Risk Category Calculator 1 0  0 0  Fall Risk Category Low Low  Low Low  Patient Fall Risk Level Low fall risk Low fall risk  Low fall risk Low fall risk  Patient at Risk for Falls Due to    No Fall Risks No Fall Risks  Fall risk Follow up    Falls evaluation completed Falls evaluation completed   Functional Status Survey:    Vitals:   07/29/22 1300  BP: 130/82  Pulse: 60  Resp: 16  Temp: 98 F (36.7 C)  SpO2: 96%  Weight: 227 lb 6.4 oz (103.1 kg)  Height: '6\' 2"'$  (1.88 m)   Body mass index is 29.2 kg/m. Physical Exam Vitals reviewed.  Constitutional:      General: He is not in acute distress.    Appearance: Normal appearance. He is overweight. He is not ill-appearing or diaphoretic.  HENT:     Head: Normocephalic.     Right Ear: Tympanic membrane, ear canal and external ear normal. There is no impacted cerumen.     Left Ear: Tympanic membrane, ear canal and external ear normal. There is no impacted cerumen.     Nose: Nose normal. No congestion or rhinorrhea.     Mouth/Throat:     Mouth: Mucous membranes are moist.     Pharynx: Oropharynx is clear. No oropharyngeal exudate or posterior oropharyngeal erythema.  Eyes:     General: No scleral icterus.       Right eye: No discharge.        Left eye: No discharge.     Extraocular Movements: Extraocular movements intact.     Conjunctiva/sclera: Conjunctivae normal.     Pupils: Pupils are equal, round, and reactive to light.  Neck:     Vascular: No carotid bruit.  Cardiovascular:     Rate and Rhythm: Normal rate and regular rhythm.     Pulses: Normal pulses.     Heart sounds: Normal heart sounds. No murmur heard.    No friction rub. No  gallop.  Pulmonary:     Effort: Pulmonary effort is normal. No respiratory distress.     Breath sounds: Normal breath sounds. No wheezing, rhonchi or rales.  Chest:     Chest wall: No tenderness.  Abdominal:     General: Bowel sounds are normal. There is no distension.     Palpations: Abdomen is soft. There is no mass.     Tenderness: There is no abdominal tenderness. There is no right CVA tenderness, left CVA tenderness, guarding or rebound.  Musculoskeletal:        General: No swelling or tenderness. Normal range of motion.     Cervical back: Normal range of motion. No rigidity or tenderness.     Right lower leg: No edema.     Left lower leg: No edema.  Feet:     Right foot:     Skin integrity: Callus present. No skin breakdown, erythema or warmth.     Left foot:     Skin integrity: Callus present. No skin breakdown, erythema or warmth.     Toenail Condition: Left toenails are long. Fungal disease present. Lymphadenopathy:     Cervical: No cervical adenopathy.  Skin:    General: Skin is warm and dry.     Coloration: Skin is not pale.     Findings: No bruising, erythema, lesion or rash.  Neurological:     Mental Status: He is alert and oriented to person, place, and time.     Cranial Nerves: No cranial nerve deficit.     Sensory: No sensory deficit.     Motor: No weakness.     Coordination: Coordination normal.     Gait: Gait normal.  Psychiatric:        Mood and Affect: Mood normal.        Speech: Speech normal.        Behavior: Behavior normal.        Thought Content: Thought content normal.        Judgment: Judgment normal.  Labs reviewed: Recent Labs    07/25/22 0808  NA 139  K 4.6  CL 107  CO2 23  GLUCOSE 106*  BUN 21  CREATININE 1.14  CALCIUM 8.9   Recent Labs    07/25/22 0808  AST 17  ALT 13  BILITOT 0.4  PROT 6.2   Recent Labs    07/25/22 0808  WBC 5.8  NEUTROABS 3,625  HGB 15.3  HCT 45.0  MCV 93.2  PLT 281   Lab Results   Component Value Date   TSH 2.00 07/12/2021   No results found for: "HGBA1C" Lab Results  Component Value Date   CHOL 211 (H) 07/25/2022   HDL 35 (L) 07/25/2022   LDLCALC 147 (H) 07/25/2022   TRIG 154 (H) 07/25/2022   CHOLHDL 6.0 (H) 07/25/2022    Significant Diagnostic Results in last 30 days:  No results found.  Assessment/Plan 1. Mixed hyperlipidemia Latest lab work higher compared to previous cho 211,TRG 154,and LDL 147 with low HDL 35  - discussed dietary modification a low saturated fats, low carbohydrates and high vegetable diet also exercise at least 3 times per week for 30 minutes. - Lipid panel; Future - COMPLETE METABOLIC PANEL WITH GFR; Future - atorvastatin (LIPITOR) 10 MG tablet; Take 1 tablet (10 mg total) by mouth daily.  Dispense: 90 tablet; Refill: 3  2. Onychomycosis of great toe Left great toe yellow in color and brittle.will refer to podiatrist for evaluation possible debridement. - Ambulatory referral to Podiatry  3. Need for pneumococcal vaccination PNA 20 vaccine administered today by CMA no reaction reported.   - Pneumococcal conjugate vaccine 20-valent (Prevnar 20)   Family/ staff Communication: Reviewed plan of care with patient verbalized understanding   Labs/tests ordered:  - Lipid panel; Future - COMPLETE METABOLIC PANEL WITH GFR; Future  Next Appointment : Return in about 4 months (around 11/28/2022) for medical mangement of chronic issues., fasting labs prior to visit.   Sandrea Hughs, NP

## 2022-07-30 LAB — HEMOGLOBIN A1C
Hgb A1c MFr Bld: 5.6 % of total Hgb (ref <5.7)
Mean Plasma Glucose: 114 mg/dL
eAG (mmol/L): 6.3 mmol/L

## 2022-07-30 LAB — COMPLETE METABOLIC PANEL WITH GFR
AG Ratio: 1.7 (calc) (ref 1.0–2.5)
ALT: 13 U/L (ref 9–46)
AST: 17 U/L (ref 10–35)
Albumin: 3.9 g/dL (ref 3.6–5.1)
Alkaline phosphatase (APISO): 71 U/L (ref 35–144)
BUN: 21 mg/dL (ref 7–25)
CO2: 23 mmol/L (ref 20–32)
Calcium: 8.9 mg/dL (ref 8.6–10.3)
Chloride: 107 mmol/L (ref 98–110)
Creat: 1.14 mg/dL (ref 0.70–1.35)
Globulin: 2.3 g/dL (calc) (ref 1.9–3.7)
Glucose, Bld: 106 mg/dL — ABNORMAL HIGH (ref 65–99)
Potassium: 4.6 mmol/L (ref 3.5–5.3)
Sodium: 139 mmol/L (ref 135–146)
Total Bilirubin: 0.4 mg/dL (ref 0.2–1.2)
Total Protein: 6.2 g/dL (ref 6.1–8.1)
eGFR: 71 mL/min/{1.73_m2} (ref >=60)

## 2022-07-30 LAB — TEST AUTHORIZATION

## 2022-07-30 LAB — CBC WITH DIFFERENTIAL/PLATELET
Absolute Monocytes: 557 cells/uL (ref 200–950)
Basophils Absolute: 70 cells/uL (ref 0–200)
Basophils Relative: 1.2 %
Eosinophils Absolute: 70 cells/uL (ref 15–500)
Eosinophils Relative: 1.2 %
HCT: 45 % (ref 38.5–50.0)
Hemoglobin: 15.3 g/dL (ref 13.2–17.1)
Lymphs Abs: 1479 cells/uL (ref 850–3900)
MCH: 31.7 pg (ref 27.0–33.0)
MCHC: 34 g/dL (ref 32.0–36.0)
MCV: 93.2 fL (ref 80.0–100.0)
MPV: 9.3 fL (ref 7.5–12.5)
Monocytes Relative: 9.6 %
Neutro Abs: 3625 cells/uL (ref 1500–7800)
Neutrophils Relative %: 62.5 %
Platelets: 281 10*3/uL (ref 140–400)
RBC: 4.83 10*6/uL (ref 4.20–5.80)
RDW: 12.2 % (ref 11.0–15.0)
Total Lymphocyte: 25.5 %
WBC: 5.8 10*3/uL (ref 3.8–10.8)

## 2022-07-30 LAB — LIPID PANEL
Cholesterol: 211 mg/dL — ABNORMAL HIGH (ref <200)
HDL: 35 mg/dL — ABNORMAL LOW (ref >=40)
LDL Cholesterol (Calc): 147 mg/dL (calc) — ABNORMAL HIGH
Non-HDL Cholesterol (Calc): 176 mg/dL (calc) — ABNORMAL HIGH (ref <130)
Total CHOL/HDL Ratio: 6 (calc) — ABNORMAL HIGH (ref <5.0)
Triglycerides: 154 mg/dL — ABNORMAL HIGH (ref <150)

## 2022-08-29 DIAGNOSIS — C44612 Basal cell carcinoma of skin of right upper limb, including shoulder: Secondary | ICD-10-CM | POA: Diagnosis not present

## 2022-08-29 DIAGNOSIS — D485 Neoplasm of uncertain behavior of skin: Secondary | ICD-10-CM | POA: Diagnosis not present

## 2022-08-29 DIAGNOSIS — L570 Actinic keratosis: Secondary | ICD-10-CM | POA: Diagnosis not present

## 2022-08-29 DIAGNOSIS — L578 Other skin changes due to chronic exposure to nonionizing radiation: Secondary | ICD-10-CM | POA: Diagnosis not present

## 2022-08-29 DIAGNOSIS — C44319 Basal cell carcinoma of skin of other parts of face: Secondary | ICD-10-CM | POA: Diagnosis not present

## 2022-08-29 DIAGNOSIS — L821 Other seborrheic keratosis: Secondary | ICD-10-CM | POA: Diagnosis not present

## 2022-08-29 DIAGNOSIS — Z85828 Personal history of other malignant neoplasm of skin: Secondary | ICD-10-CM | POA: Diagnosis not present

## 2022-09-05 ENCOUNTER — Ambulatory Visit (INDEPENDENT_AMBULATORY_CARE_PROVIDER_SITE_OTHER): Payer: PPO | Admitting: Adult Health

## 2022-09-05 ENCOUNTER — Encounter: Payer: Self-pay | Admitting: Adult Health

## 2022-09-05 VITALS — BP 124/74 | HR 77 | Temp 98.1°F | Resp 18 | Ht 74.0 in | Wt 229.2 lb

## 2022-09-05 DIAGNOSIS — M25551 Pain in right hip: Secondary | ICD-10-CM | POA: Diagnosis not present

## 2022-09-05 NOTE — Progress Notes (Signed)
[facility] clinic  Provider:  Durenda Age DNP  Code Status:  Full Code  Goals of Care:     07/29/2022    1:06 PM  Advanced Directives  Does Patient Have a Medical Advance Directive? No  Would patient like information on creating a medical advance directive? No - Patient declined     Chief Complaint  Patient presents with   Acute Visit    Patient has been having low back pain since Thursday. Patient has used a few muscle relaxers.    HPI: Patient is a 66 y.o. male seen today for an acute visit for low back pain. She has been complaining of right low back pain X 1 week. When he walks, he starts having spasm and "will grab me". When he is sitting, there is no pain but walking is 1/10 pain. There are certain moment that causes him to be in pain. He denies difficulty urinating. He denies trauma. He does acupuncture twice a year but does not relieve his pain now. He has history of right  and left total hip arthroplasty. Discussed that imaging can be done on his lower back He declined and stated that he will follow up with orthopedics and they might order imaging too.    Past Medical History:  Diagnosis Date   Anemia    LATE TEENS EARLY 20S    Arthritis    RIGHT HIP    Basal cell carcinoma    MULTIPLE OVER THE YEARS   Hepatitis    HEP A IN THE EARLY 80s , TREATED AND RESOLVED    Tinnitus     Past Surgical History:  Procedure Laterality Date   COLONOSCOPY     TONSILLECTOMY     CHILDHOOD    TOTAL HIP ARTHROPLASTY Right 11/23/2018   Procedure: RIGHT TOTAL HIP ARTHROPLASTY ANTERIOR APPROACH;  Surgeon: Mcarthur Rossetti, MD;  Location: [facility];  Service: Orthopedics;  Laterality: Right;   TOTAL HIP ARTHROPLASTY Left 10/18/2019   Procedure: LEFT TOTAL HIP ARTHROPLASTY ANTERIOR APPROACH;  Surgeon: Mcarthur Rossetti, MD;  Location: [facility];  Service: Orthopedics;  Laterality: Left;    Allergies  Allergen Reactions   Other Shortness Of Breath and Swelling    Pan  alba,   Swelling in throat and tounge    Outpatient Encounter Medications as of 09/05/2022  Medication Sig   Ascorbic Acid (VITAMIN C) 1000 MG tablet Take 1,000 mg by mouth in the morning and at bedtime.   atorvastatin (LIPITOR) 10 MG tablet Take 1 tablet (10 mg total) by mouth daily.   cetirizine (ZYRTEC) 10 MG tablet Take 10 mg by mouth as needed for allergies.   fluticasone (FLONASE) 50 MCG/ACT nasal spray Place 2 sprays into both nostrils as needed for allergies or rhinitis.   ibuprofen (ADVIL) 200 MG tablet Take 200 mg by mouth as needed for moderate pain or mild pain.   Multiple Vitamin (MULTIVITAMIN ADULT PO) Take 1 tablet by mouth daily.   No facility-administered encounter medications on file as of 09/05/2022.    Review of Systems:  Review of Systems  Constitutional:  Negative for activity change, appetite change and fever.  HENT:  Negative for sore throat.   Eyes: Negative.   Cardiovascular:  Negative for chest pain and leg swelling.  Gastrointestinal:  Negative for abdominal distention, diarrhea and vomiting.  Genitourinary:  Negative for dysuria, frequency and urgency.  Musculoskeletal:  Positive for back pain.  Skin:  Negative for color change.  Neurological:  Negative  for dizziness and headaches.  Psychiatric/Behavioral:  Negative for behavioral problems and sleep disturbance. The patient is not nervous/anxious.     Health Maintenance  Topic Date Due   COLONOSCOPY (Pts 45-75yr Insurance coverage will need to be confirmed)  01/03/2021   COVID-19 Vaccine (5 - Pfizer risk series) 10/22/2021   INFLUENZA VACCINE  Never done   TETANUS/TDAP  08/14/2025   Pneumonia Vaccine 65 Years old  Completed   Hepatitis C Screening  Completed   HIV Screening  Completed   Zoster Vaccines- Shingrix  Completed   HPV VACCINES  Aged Out    Physical Exam: Vitals:   09/05/22 1343  BP: 124/74  Pulse: 77  Resp: 18  Temp: 98.1 F (36.7 C)  TempSrc: Temporal  SpO2: 96%  Weight: 229  lb 3.2 oz (104 kg)  Height: '6\' 2"'$  (1.88 m)   Body mass index is 29.43 kg/m. Physical Exam Constitutional:      Appearance: Normal appearance.  HENT:     Head: Normocephalic and atraumatic.     Mouth/Throat:     Mouth: Mucous membranes are moist.  Eyes:     Conjunctiva/sclera: Conjunctivae normal.  Cardiovascular:     Rate and Rhythm: Normal rate and regular rhythm.     Pulses: Normal pulses.     Heart sounds: Normal heart sounds.  Pulmonary:     Effort: Pulmonary effort is normal.     Breath sounds: Normal breath sounds.  Abdominal:     General: Bowel sounds are normal.     Palpations: Abdomen is soft.  Musculoskeletal:        General: No swelling. Normal range of motion.     Cervical back: Normal range of motion.     Comments: Right hip pain  Skin:    General: Skin is warm and dry.  Neurological:     General: No focal deficit present.     Mental Status: He is alert and oriented to person, place, and time.  Psychiatric:        Mood and Affect: Mood normal.        Behavior: Behavior normal.        Thought Content: Thought content normal.        Judgment: Judgment normal.     Labs reviewed: Basic Metabolic Panel: Recent Labs    07/25/22 0808  NA 139  K 4.6  CL 107  CO2 23  GLUCOSE 106*  BUN 21  CREATININE 1.14  CALCIUM 8.9   Liver Function Tests: Recent Labs    07/25/22 0808  AST 17  ALT 13  BILITOT 0.4  PROT 6.2   No results for input(s): "LIPASE", "AMYLASE" in the last 8760 hours. No results for input(s): "AMMONIA" in the last 8760 hours. CBC: Recent Labs    07/25/22 0808  WBC 5.8  NEUTROABS 3,625  HGB 15.3  HCT 45.0  MCV 93.2  PLT 281   Lipid Panel: Recent Labs    07/25/22 0808  CHOL 211*  HDL 35*  LDLCALC 147*  TRIG 154*  CHOLHDL 6.0*   Lab Results  Component Value Date   HGBA1C 5.6 07/25/2022    Procedures since last visit: No results found.  Assessment/Plan  1. Right hip pain -  declined imaging, will follow up with  orthopedics -  discussed that he can apply Lidocaine patch 4% to right hip daily PRN -  may apply heating pad to right hip as needed to help with pain -  continue Ibuprofen PRN  Labs/tests ordered:  None  Next appt:  11/11/2022

## 2022-09-05 NOTE — Patient Instructions (Signed)
Hip Pain The hip is the joint between the upper legs and the lower pelvis. The bones, cartilage, tendons, and muscles of your hip joint support your body and allow you to move around. Hip pain can range from a minor ache to severe pain in one or both of your hips. The pain may be felt on the inside of the hip joint near the groin, or on the outside near the buttocks and upper thigh. You may also have swelling or stiffness in your hip area. Follow these instructions at home: Managing pain, stiffness, and swelling     If directed, put ice on the painful area. To do this: Put ice in a plastic bag. Place a towel between your skin and the bag. Leave the ice on for 20 minutes, 2-3 times a day. If directed, apply heat to the affected area as often as told by your health care provider. Use the heat source that your health care provider recommends, such as a moist heat pack or a heating pad. Place a towel between your skin and the heat source. Leave the heat on for 20-30 minutes. Remove the heat if your skin turns bright red. This is especially important if you are unable to feel pain, heat, or cold. You may have a greater risk of getting burned. Activity Do exercises as told by your health care provider. Avoid activities that cause pain. General instructions  Take over-the-counter and prescription medicines only as told by your health care provider. Keep a journal of your symptoms. Write down: How often you have hip pain. The location of your pain. What the pain feels like. What makes the pain worse. Sleep with a pillow between your legs on your most comfortable side. Keep all follow-up visits as told by your health care provider. This is important. Contact a health care provider if: You cannot put weight on your leg. Your pain or swelling continues or gets worse after one week. It gets harder to walk. You have a fever. Get help right away if: You fall. You have a sudden increase in pain  and swelling in your hip. Your hip is red or swollen or very tender to touch. Summary Hip pain can range from a minor ache to severe pain in one or both of your hips. The pain may be felt on the inside of the hip joint near the groin, or on the outside near the buttocks and upper thigh. Avoid activities that cause pain. Write down how often you have hip pain, the location of the pain, what makes it worse, and what it feels like. This information is not intended to replace advice given to you by your health care provider. Make sure you discuss any questions you have with your health care provider. Document Revised: 04/08/2019 Document Reviewed: 04/08/2019 Elsevier Patient Education  2023 [employer]  

## 2022-09-21 ENCOUNTER — Ambulatory Visit: Payer: BC Managed Care – PPO | Admitting: Orthopaedic Surgery

## 2022-09-22 ENCOUNTER — Ambulatory Visit (INDEPENDENT_AMBULATORY_CARE_PROVIDER_SITE_OTHER): Payer: PPO | Admitting: Orthopaedic Surgery

## 2022-09-22 ENCOUNTER — Ambulatory Visit (INDEPENDENT_AMBULATORY_CARE_PROVIDER_SITE_OTHER): Payer: PPO

## 2022-09-22 DIAGNOSIS — G8929 Other chronic pain: Secondary | ICD-10-CM | POA: Diagnosis not present

## 2022-09-22 DIAGNOSIS — M545 Low back pain, unspecified: Secondary | ICD-10-CM

## 2022-09-22 MED ORDER — METHOCARBAMOL 500 MG PO TABS: 500.0000 mg | ORAL_TABLET | Freq: Four times a day (QID) | ORAL | 1 refills | Status: AC | PRN

## 2022-09-22 MED ORDER — CELECOXIB 200 MG PO CAPS: 200.0000 mg | ORAL_CAPSULE | Freq: Two times a day (BID) | ORAL | 1 refills | Status: AC | PRN

## 2022-09-22 NOTE — Progress Notes (Signed)
The patient is a 66 year old gentleman well-known to me.  I have replaced both of his hips.  He does deal with chronic low back pain with no radicular symptoms.  A lot of times is in the facet joint area where he points to more to the right and left.  He has had chiropractic treatments in the past and occasionally massage therapy and even acupuncture.  He is very mobile.  He gets at times where his back is "tired".  He has excellent flexion extension of his lumbar spine with no discomfort today at all.  He has excellent range of motion of both hips and his leg lengths are equal.  He says his back is much better since he had his hips replaced.  He has excellent strength in his Bieler extremities and normal sensation as well.  He has negative straight leg raise bilaterally.  He is very mobile today and actually looks good.  2 views of the lumbar spine show significant spondylosis with degenerative changes at multiple levels especially the posterior elements at the lower lumbar spine with arthritic changes.  There is also significant degenerative disc disease.  Most of his pain seems to be facet joint arthritis related.  I recommended topical Voltaren gel on occasion as well as I will send in some Celebrex and methocarbamol for when he does have acute flareups of pain.  At some point if it bothers him enough I would send him to Dr. Ernestina Patches for facet joint injections.  Right now treatment will remain conservative since he is doing well.  All question concerns were answered and addressed.

## 2022-09-30 ENCOUNTER — Ambulatory Visit (INDEPENDENT_AMBULATORY_CARE_PROVIDER_SITE_OTHER): Payer: PPO | Admitting: Podiatry

## 2022-09-30 ENCOUNTER — Encounter: Payer: Self-pay | Admitting: Podiatry

## 2022-09-30 DIAGNOSIS — M2041 Other hammer toe(s) (acquired), right foot: Secondary | ICD-10-CM | POA: Diagnosis not present

## 2022-09-30 DIAGNOSIS — M2042 Other hammer toe(s) (acquired), left foot: Secondary | ICD-10-CM | POA: Diagnosis not present

## 2022-09-30 DIAGNOSIS — L84 Corns and callosities: Secondary | ICD-10-CM

## 2022-09-30 DIAGNOSIS — M21619 Bunion of unspecified foot: Secondary | ICD-10-CM | POA: Diagnosis not present

## 2022-10-01 NOTE — Progress Notes (Signed)
Subjective:   Patient ID: Juan Powers, male   DOB: 66 y.o.   MRN: [id number]   HPI Patient presents stating that he has pain that has developed in both of his feet with long-term structural deformity.  States he develops bad corns and he has a discolored nail bed left that he is concerned about with nontenderness currently.  Patient does not smoke likes to be active   Review of Systems  All other systems reviewed and are negative.       Objective:  Physical Exam Vitals and nursing note reviewed.  Constitutional:      Appearance: He is well-developed.  Pulmonary:     Effort: Pulmonary effort is normal.  Musculoskeletal:        General: Normal range of motion.  Skin:    General: Skin is warm.  Neurological:     Mental Status: He is alert.     Neurovascular status moderately diminished with diminished pulses PT DP +1/4.  Patient has dry tight skin has moderate diminishment of sharp dull vibratory and significant structural deformity bilateral.  There is found to be a thick callus second right distal and on the left there is a subcallus first metatarsal the right painful.  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  Patient is also found to have traumatized left hallux nailbed versus probable fungal infection     Plan:  H&P discussed all conditions with patient.  Do not recommend treatment for nail as it is nonpainful and I did went ahead and did sharp sterile debridement of lesions no angiogenic bleeding and advised him on wider shoes padding therapy.  Patient will be seen back as needed may return later routine care

## 2022-11-11 ENCOUNTER — Other Ambulatory Visit: Payer: PPO

## 2022-11-11 DIAGNOSIS — E782 Mixed hyperlipidemia: Secondary | ICD-10-CM

## 2022-11-18 ENCOUNTER — Other Ambulatory Visit: Payer: PPO

## 2022-11-21 ENCOUNTER — Other Ambulatory Visit: Payer: PPO

## 2022-11-21 DIAGNOSIS — E782 Mixed hyperlipidemia: Secondary | ICD-10-CM | POA: Diagnosis not present

## 2022-11-25 LAB — COMPLETE METABOLIC PANEL WITH GFR
AG Ratio: 1.7 (calc) (ref 1.0–2.5)
ALT: 18 U/L (ref 9–46)
AST: 19 U/L (ref 10–35)
Albumin: 4 g/dL (ref 3.6–5.1)
Alkaline phosphatase (APISO): 60 U/L (ref 35–144)
BUN: 17 mg/dL (ref 7–25)
CO2: 28 mmol/L (ref 20–32)
Calcium: 8.9 mg/dL (ref 8.6–10.3)
Chloride: 105 mmol/L (ref 98–110)
Creat: 1.07 mg/dL (ref 0.70–1.35)
Globulin: 2.4 g/dL (calc) (ref 1.9–3.7)
Glucose, Bld: 110 mg/dL — ABNORMAL HIGH (ref 65–99)
Potassium: 4.7 mmol/L (ref 3.5–5.3)
Sodium: 138 mmol/L (ref 135–146)
Total Bilirubin: 0.5 mg/dL (ref 0.2–1.2)
Total Protein: 6.4 g/dL (ref 6.1–8.1)
eGFR: 77 mL/min/{1.73_m2} (ref >=60)

## 2022-11-25 LAB — HEMOGLOBIN A1C

## 2022-11-25 LAB — TEST AUTHORIZATION

## 2022-11-25 LAB — LIPID PANEL
Cholesterol: 136 mg/dL (ref <200)
HDL: 39 mg/dL — ABNORMAL LOW (ref >=40)
LDL Cholesterol (Calc): 80 mg/dL (calc)
Non-HDL Cholesterol (Calc): 97 mg/dL (calc) (ref <130)
Total CHOL/HDL Ratio: 3.5 (calc) (ref <5.0)
Triglycerides: 85 mg/dL (ref <150)

## 2022-11-30 ENCOUNTER — Encounter: Payer: Self-pay | Admitting: Adult Health

## 2022-11-30 ENCOUNTER — Ambulatory Visit (INDEPENDENT_AMBULATORY_CARE_PROVIDER_SITE_OTHER): Payer: PPO | Admitting: Adult Health

## 2022-11-30 VITALS — BP 126/80 | HR 66 | Temp 97.3°F | Resp 18

## 2022-11-30 DIAGNOSIS — B9789 Other viral agents as the cause of diseases classified elsewhere: Secondary | ICD-10-CM

## 2022-11-30 DIAGNOSIS — J988 Other specified respiratory disorders: Secondary | ICD-10-CM | POA: Diagnosis not present

## 2022-11-30 DIAGNOSIS — R0989 Other specified symptoms and signs involving the circulatory and respiratory systems: Secondary | ICD-10-CM | POA: Diagnosis not present

## 2022-11-30 LAB — POCT INFLUENZA A/B
Influenza A, POC: NEGATIVE
Influenza B, POC: NEGATIVE

## 2022-11-30 LAB — POC COVID19 BINAXNOW: SARS Coronavirus 2 Ag: NEGATIVE

## 2022-11-30 MED ORDER — GUAIFENESIN-CODEINE 100-10 MG/5ML PO SOLN: 5.0000 mL | Freq: Four times a day (QID) | ORAL | 0 refills | Status: AC | PRN

## 2022-11-30 NOTE — Patient Instructions (Signed)

## 2022-11-30 NOTE — Progress Notes (Signed)
[facility] clinic  Provider:  Durenda Age DNP  Code Status:  Full Code  Goals of Care:     07/29/2022    1:06 PM  Advanced Directives  Does Patient Have a Medical Advance Directive? No  Would patient like information on creating a medical advance directive? No - Patient declined     Chief Complaint  Patient presents with   Acute Visit    Cough and head congestion    HPI: Patient is a 66 y.o. male seen today for an acute visit for cough and head congestion. He stated that his wife had a bad cold a week ago.  He has persistent dry cough that interferes with his sleep at night.  He started having runny nose, post nasal drip and blowing nose with whitish drainage 3 days ago. He denies having fever, chills nor body aches.  Today, COVID-19 test and Infuenza A/B test were negative.  Past Medical History:  Diagnosis Date   Anemia    LATE TEENS EARLY 20S    Arthritis    RIGHT HIP    Basal cell carcinoma    MULTIPLE OVER THE YEARS   Hepatitis    HEP A IN THE EARLY 80s , TREATED AND RESOLVED    Tinnitus     Past Surgical History:  Procedure Laterality Date   COLONOSCOPY     TONSILLECTOMY     CHILDHOOD    TOTAL HIP ARTHROPLASTY Right 11/23/2018   Procedure: RIGHT TOTAL HIP ARTHROPLASTY ANTERIOR APPROACH;  Surgeon: Mcarthur Rossetti, MD;  Location: [facility];  Service: Orthopedics;  Laterality: Right;   TOTAL HIP ARTHROPLASTY Left 10/18/2019   Procedure: LEFT TOTAL HIP ARTHROPLASTY ANTERIOR APPROACH;  Surgeon: Mcarthur Rossetti, MD;  Location: [facility];  Service: Orthopedics;  Laterality: Left;    Allergies  Allergen Reactions   Other Shortness Of Breath and Swelling    Pan alba,   Swelling in throat and tounge    Outpatient Encounter Medications as of 11/30/2022  Medication Sig   Ascorbic Acid (VITAMIN C) 1000 MG tablet Take 1,000 mg by mouth in the morning and at bedtime.   atorvastatin (LIPITOR) 10 MG tablet Take 1 tablet (10 mg total) by mouth daily.    celecoxib (CELEBREX) 200 MG capsule Take 1 capsule (200 mg total) by mouth 2 (two) times daily between meals as needed.   cetirizine (ZYRTEC) 10 MG tablet Take 10 mg by mouth as needed for allergies.   fluticasone (FLONASE) 50 MCG/ACT nasal spray Place 2 sprays into both nostrils as needed for allergies or rhinitis.   guaiFENesin-codeine 100-10 MG/5ML syrup Take 5 mLs by mouth every 6 (six) hours as needed for up to 5 days for cough.   ibuprofen (ADVIL) 200 MG tablet Take 200 mg by mouth as needed for moderate pain or mild pain.   methocarbamol (ROBAXIN) 500 MG tablet Take 1 tablet (500 mg total) by mouth every 6 (six) hours as needed.   Multiple Vitamin (MULTIVITAMIN ADULT PO) Take 1 tablet by mouth daily.   No facility-administered encounter medications on file as of 11/30/2022.    Review of Systems:  Review of Systems  Constitutional:  Negative for activity change, appetite change and fever.  HENT:  Positive for congestion and sore throat.   Eyes: Negative.   Respiratory:  Positive for cough.   Cardiovascular:  Negative for chest pain and leg swelling.  Gastrointestinal:  Negative for abdominal distention, diarrhea and vomiting.  Genitourinary:  Negative for dysuria, frequency  and urgency.  Skin:  Negative for color change.  Neurological:  Negative for dizziness and headaches.  Psychiatric/Behavioral:  Positive for sleep disturbance. Negative for behavioral problems. The patient is not nervous/anxious.     Health Maintenance  Topic Date Due   Medicare Annual Wellness (AWV)  Never done   DTaP/Tdap/Td (2 - Td or Tdap) 08/21/2019   COLONOSCOPY (Pts 45-49yr Insurance coverage will need to be confirmed)  01/03/2021   INFLUENZA VACCINE  Never done   COVID-19 Vaccine (5 - 2023-24 season) 08/05/2022   Pneumonia Vaccine 65 Years old  Completed   Hepatitis C Screening  Completed   HIV Screening  Completed   Zoster Vaccines- Shingrix  Completed   HPV VACCINES  Aged Out    Physical  Exam: Vitals:   11/30/22 1408  BP: 126/80  Pulse: 66  Resp: 18  Temp: (!) 97.3 F (36.3 C)  SpO2: 95%   There is no height or weight on file to calculate BMI. Physical Exam Constitutional:      Appearance: Normal appearance.  HENT:     Head: Normocephalic and atraumatic.     Mouth/Throat:     Mouth: Mucous membranes are moist.  Eyes:     Conjunctiva/sclera: Conjunctivae normal.  Cardiovascular:     Rate and Rhythm: Normal rate and regular rhythm.     Pulses: Normal pulses.     Heart sounds: Normal heart sounds.  Pulmonary:     Effort: Pulmonary effort is normal.     Breath sounds: Normal breath sounds.  Abdominal:     General: Bowel sounds are normal.     Palpations: Abdomen is soft.  Musculoskeletal:        General: No swelling. Normal range of motion.     Cervical back: Normal range of motion.  Skin:    General: Skin is warm and dry.  Neurological:     General: No focal deficit present.     Mental Status: He is alert and oriented to person, place, and time.  Psychiatric:        Mood and Affect: Mood normal.        Behavior: Behavior normal.        Thought Content: Thought content normal.        Judgment: Judgment normal.     Labs reviewed: Basic Metabolic Panel: Recent Labs    07/25/22 0808 11/21/22 0834  NA 139 138  K 4.6 4.7  CL 107 105  CO2 23 28  GLUCOSE 106* 110*  BUN 21 17  CREATININE 1.14 1.07  CALCIUM 8.9 8.9   Liver Function Tests: Recent Labs    07/25/22 0808 11/21/22 0834  AST 17 19  ALT 13 18  BILITOT 0.4 0.5  PROT 6.2 6.4   No results for input(s): "LIPASE", "AMYLASE" in the last 8760 hours. No results for input(s): "AMMONIA" in the last 8760 hours. CBC: Recent Labs    07/25/22 0808  WBC 5.8  NEUTROABS 3,625  HGB 15.3  HCT 45.0  MCV 93.2  PLT 281   Lipid Panel: Recent Labs    07/25/22 0808 11/21/22 0834  CHOL 211* 136  HDL 35* 39*  LDLCALC 147* 80  TRIG 154* 85  CHOLHDL 6.0* 3.5   Lab Results  Component  Value Date   HGBA1C CANCELED 11/21/2022    Procedures since last visit: No results found.  Assessment/Plan  1. Viral respiratory infection -  encouraged to eat healthy meals and increase fluid intake - guaiFENesin-codeine 100-10  MG/5ML syrup; Take 5 mLs by mouth every 6 (six) hours as needed for up to 5 days for cough.  Dispense: 100 mL; Refill: 0  2. Upper respiratory symptom - POC Influenza A/B - POC COVID-19 - guaiFENesin-codeine 100-10 MG/5ML syrup; Take 5 mLs by mouth every 6 (six) hours as needed for up to 5 days for cough.  Dispense: 100 mL; Refill: 0  Instructed to do steam inhalation TID  Labs/tests ordered:  COVID-19 test, Influenza A/B  Next appt:  12/09/2022

## 2022-12-08 DIAGNOSIS — C44319 Basal cell carcinoma of skin of other parts of face: Secondary | ICD-10-CM | POA: Diagnosis not present

## 2022-12-09 ENCOUNTER — Encounter: Payer: PPO | Admitting: Orthopedic Surgery

## 2022-12-09 NOTE — Progress Notes (Unsigned)
Careteam: Patient Care Team: Ngetich, Nelda Bucks, NP as PCP - General (Family Medicine) Mcarthur Rossetti, MD as Consulting Physician (Orthopedic Surgery) Jari Pigg, MD as Consulting Physician (Dermatology)  Seen by: Windell Moulding, AGNP-C  PLACE OF SERVICE:  [place]  Advanced Directive information    Allergies  Allergen Reactions   Other Shortness Of Breath and Swelling    Pan alba,   Swelling in throat and tounge    Chief Complaint  Patient presents with   Medical Management of Chronic Issues    4 month follow up     HPI: Patient is a 67 y.o. male seen today for medical management of chronic conditions.   Colonoscopy  Tdap/flu/covid     Review of Systems:  ROS***  Past Medical History:  Diagnosis Date   Anemia    LATE TEENS EARLY 20S    Arthritis    RIGHT HIP    Basal cell carcinoma    MULTIPLE OVER THE YEARS   Hepatitis    HEP A IN THE EARLY 80s , TREATED AND RESOLVED    Tinnitus    Past Surgical History:  Procedure Laterality Date   COLONOSCOPY     TONSILLECTOMY     CHILDHOOD    TOTAL HIP ARTHROPLASTY Right 11/23/2018   Procedure: RIGHT TOTAL HIP ARTHROPLASTY ANTERIOR APPROACH;  Surgeon: Mcarthur Rossetti, MD;  [facility];  Service: Orthopedics;  Laterality: Right;   TOTAL HIP ARTHROPLASTY Left 10/18/2019   Procedure: LEFT TOTAL HIP ARTHROPLASTY ANTERIOR APPROACH;  Surgeon: Mcarthur Rossetti, MD;  [facility];  Service: Orthopedics;  Laterality: Left;   Social History:   reports that he quit smoking about 24 years ago. His smoking use included cigarettes. He has a 20.00 pack-year smoking history. He has never used smokeless tobacco. He reports current alcohol use of about 1.0 - 2.0 standard drink of alcohol per week. He reports that he does not currently use drugs after having used the following drugs: Marijuana.  Family History  Problem Relation Age of Onset   Heart disease Father 9   Lung cancer Father    Breast  cancer Sister 57    Medications: Patient's Medications  New Prescriptions   No medications on file  Previous Medications   ASCORBIC ACID (VITAMIN C) 1000 MG TABLET    Take 1,000 mg by mouth in the morning and at bedtime.   ATORVASTATIN (LIPITOR) 10 MG TABLET    Take 1 tablet (10 mg total) by mouth daily.   CELECOXIB (CELEBREX) 200 MG CAPSULE    Take 1 capsule (200 mg total) by mouth 2 (two) times daily between meals as needed.   CETIRIZINE (ZYRTEC) 10 MG TABLET    Take 10 mg by mouth as needed for allergies.   FLUTICASONE (FLONASE) 50 MCG/ACT NASAL SPRAY    Place 2 sprays into both nostrils as needed for allergies or rhinitis.   IBUPROFEN (ADVIL) 200 MG TABLET    Take 200 mg by mouth as needed for moderate pain or mild pain.   METHOCARBAMOL (ROBAXIN) 500 MG TABLET    Take 1 tablet (500 mg total) by mouth every 6 (six) hours as needed.   MULTIPLE VITAMIN (MULTIVITAMIN ADULT PO)    Take 1 tablet by mouth daily.  Modified Medications   No medications on file  Discontinued Medications   No medications on file    Physical Exam:  There were no vitals filed for this visit. There is no height or  weight on file to calculate BMI. Wt Readings from Last 3 Encounters:  09/05/22 229 lb 3.2 oz (104 kg)  07/29/22 227 lb 6.4 oz (103.1 kg)  07/15/21 219 lb 6.4 oz (99.5 kg)    Physical Exam***  Labs reviewed: Basic Metabolic Panel: Recent Labs    07/25/22 0808 11/21/22 0834  NA 139 138  K 4.6 4.7  CL 107 105  CO2 23 28  GLUCOSE 106* 110*  BUN 21 17  CREATININE 1.14 1.07  CALCIUM 8.9 8.9   Liver Function Tests: Recent Labs    07/25/22 0808 11/21/22 0834  AST 17 19  ALT 13 18  BILITOT 0.4 0.5  PROT 6.2 6.4   No results for input(s): "LIPASE", "AMYLASE" in the last 8760 hours. No results for input(s): "AMMONIA" in the last 8760 hours. CBC: Recent Labs    07/25/22 0808  WBC 5.8  NEUTROABS 3,625  HGB 15.3  HCT 45.0  MCV 93.2  PLT 281   Lipid Panel: Recent Labs     07/25/22 0808 11/21/22 0834  CHOL 211* 136  HDL 35* 39*  LDLCALC 147* 80  TRIG 154* 85  CHOLHDL 6.0* 3.5   TSH: No results for input(s): "TSH" in the last 8760 hours. A1C: Lab Results  Component Value Date   HGBA1C CANCELED 11/21/2022     Assessment/Plan There are no diagnoses linked to this encounter.  Next appt: *** Ameah Chanda Walker, [facility] [phone]

## 2022-12-12 NOTE — Progress Notes (Signed)
This encounter was created in error - please disregard.

## 2023-01-09 DIAGNOSIS — C44612 Basal cell carcinoma of skin of right upper limb, including shoulder: Secondary | ICD-10-CM | POA: Diagnosis not present

## 2023-01-09 DIAGNOSIS — D485 Neoplasm of uncertain behavior of skin: Secondary | ICD-10-CM | POA: Diagnosis not present

## 2023-02-17 DIAGNOSIS — Z85828 Personal history of other malignant neoplasm of skin: Secondary | ICD-10-CM | POA: Diagnosis not present

## 2023-02-17 DIAGNOSIS — L570 Actinic keratosis: Secondary | ICD-10-CM | POA: Diagnosis not present

## 2023-02-17 DIAGNOSIS — L821 Other seborrheic keratosis: Secondary | ICD-10-CM | POA: Diagnosis not present

## 2023-02-17 DIAGNOSIS — D225 Melanocytic nevi of trunk: Secondary | ICD-10-CM | POA: Diagnosis not present

## 2023-02-17 DIAGNOSIS — L578 Other skin changes due to chronic exposure to nonionizing radiation: Secondary | ICD-10-CM | POA: Diagnosis not present

## 2023-04-03 ENCOUNTER — Ambulatory Visit (INDEPENDENT_AMBULATORY_CARE_PROVIDER_SITE_OTHER): Payer: PPO | Admitting: Family

## 2023-04-03 ENCOUNTER — Encounter: Payer: Self-pay | Admitting: Family

## 2023-04-03 DIAGNOSIS — Z0000 Encounter for general adult medical examination without abnormal findings: Secondary | ICD-10-CM | POA: Diagnosis not present

## 2023-04-03 DIAGNOSIS — Z1211 Encounter for screening for malignant neoplasm of colon: Secondary | ICD-10-CM

## 2023-04-03 NOTE — Progress Notes (Addendum)
Subjective:   Juan Powers is a 67 y.o. male who presents for Medicare Annual/Subsequent preventive examination.  Review of Systems     Cardiac Risk Factors include: advanced age (>37men, >69 women);dyslipidemia;male gender     Objective:    Today's Vitals   04/03/23 1055  PainSc: 2    There is no height or weight on file to calculate BMI.     04/03/2023   10:22 AM 07/29/2022    1:06 PM 07/14/2021    3:42 PM 10/01/2020    3:40 PM 06/22/2020    1:38 PM 10/18/2019   10:29 AM 10/15/2019    2:34 PM  Advanced Directives  Does Patient Have a Medical Advance Directive? Yes No No Yes Yes Yes Yes  Type of Estate agent of [place];Living will   Healthcare Power of [place];Living will Healthcare Power of [place];Living will Healthcare Power of [employer] of [place];Living will  Does patient want to make changes to medical advance directive? No - Patient declined   No - Patient declined No - Patient declined No - Patient declined No - Patient declined  Copy of Healthcare Power of Attorney in Chart? No - copy requested   Yes - validated most recent copy scanned in chart (See row information) No - copy requested No - copy requested   Would patient like information on creating a medical advance directive?  No - Patient declined No - Patient declined        Current Medications (verified) Outpatient Encounter Medications as of 04/03/2023  Medication Sig   Ascorbic Acid (VITAMIN C) 1000 MG tablet Take 1,000 mg by mouth in the morning and at bedtime.   atorvastatin (LIPITOR) 10 MG tablet Take 1 tablet (10 mg total) by mouth daily.   celecoxib (CELEBREX) 200 MG capsule Take 1 capsule (200 mg total) by mouth 2 (two) times daily between meals as needed.   cetirizine (ZYRTEC) 10 MG tablet Take 10 mg by mouth as needed for allergies.   fluticasone (FLONASE) 50 MCG/ACT nasal spray Place 2 sprays into both nostrils as needed for allergies or rhinitis.    ibuprofen (ADVIL) 200 MG tablet Take 200 mg by mouth as needed for moderate pain or mild pain.   methocarbamol (ROBAXIN) 500 MG tablet Take 1 tablet (500 mg total) by mouth every 6 (six) hours as needed.   Multiple Vitamin (MULTIVITAMIN ADULT PO) Take 1 tablet by mouth daily.   No facility-administered encounter medications on file as of 04/03/2023.    Allergies (verified) Other   History: Past Medical History:  Diagnosis Date   Anemia    LATE TEENS EARLY 20S    Arthritis    RIGHT HIP    Basal cell carcinoma    MULTIPLE OVER THE YEARS   Hepatitis    HEP A IN THE EARLY 80s , TREATED AND RESOLVED    Tinnitus    Past Surgical History:  Procedure Laterality Date   COLONOSCOPY     TONSILLECTOMY     CHILDHOOD    TOTAL HIP ARTHROPLASTY Right 11/23/2018   Procedure: RIGHT TOTAL HIP ARTHROPLASTY ANTERIOR APPROACH;  Surgeon: Kathryne Hitch, MD;  [facility];  Service: Orthopedics;  Laterality: Right;   TOTAL HIP ARTHROPLASTY Left 10/18/2019   Procedure: LEFT TOTAL HIP ARTHROPLASTY ANTERIOR APPROACH;  Surgeon: Kathryne Hitch, MD;  [facility];  Service: Orthopedics;  Laterality: Left;   Family History  Problem Relation Age of Onset   Heart disease Father  83   Lung cancer Father    Breast cancer Sister 79   Social History   Socioeconomic History   Marital status: Married    Spouse name: Not on file   Number of children: Not on file   Years of education: Not on file   Highest education level: Not on file  Occupational History   Occupation: Carpenter     Comment: [employer]   Tobacco Use   Smoking status: Former    Packs/day: 1.00    Years: 20.00    Additional pack years: 0.00    Total pack years: 20.00    Types: Cigarettes    Quit date: 2000    Years since quitting: 24.3   Smokeless tobacco: Never  Substance and Sexual Activity   Alcohol use: Yes    Alcohol/week: 1.0 - 2.0 standard drink of alcohol    Types: 1 - 2 Standard drinks  or equivalent per week    Comment: [facility]   Drug use: Not Currently    Types: Marijuana    Comment: YEARS    Sexual activity: Not on file  Other Topics Concern   Not on file  Social History Narrative   Diet:       Do you drink/ eat things with caffeine?  Coffee       Marital status:    Married                           What year were you married ?  1982      Do you live in a house, apartment,assistred living, condo, trailer, etc.)?  House      Is it one or more stories?   1      How many persons live in your home ?   2      Do you have any pets in your home ?(please list)   None      Highest Level of education completed:  B.S.      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                              Type & how often       ADVANCED DIRECTIVES (Please bring copies)      Do you have a living will?       Do you have a DNR form?     No                  If not, do you want to discuss one?  No      Do you have signed POA?HPOA forms?                 If so, please bring to your appointment      FUNCTIONAL STATUS- To be completed by Spouse / child / Staff       Do you have difficulty bathing or dressing yourself ?  No      Do you have difficulty preparing food or eating ?  No      Do you have difficulty managing your mediation ?  No      Do you have difficulty managing your finances ?   No      Do you have difficulty affording your medication ?   No  Social Determinants of Health   Financial Resource Strain: Not on file  Food Insecurity: Not on file  Transportation Needs: Not on file  Physical Activity: Not on file  Stress: Not on file  Social Connections: Not on file    Tobacco Counseling Counseling given: Not Answered   Clinical Intake:  Pre-visit preparation completed: No  Pain : 0-10 Pain Score: 2  Pain Type: Chronic pain Pain Location: Back Pain Orientation: Lower Pain Radiating Towards: No Pain Descriptors / Indicators:  Aching (stiffness) Pain Onset: More than a month ago Pain Frequency: Intermittent (frequently) Pain Relieving Factors: Muscle relaxant ,Ibuprofen ,mosit heat and stretching Effect of Pain on Daily Activities: walking,standing up straight  Pain Relieving Factors: Muscle relaxant ,Ibuprofen ,mosit heat and stretching  BMI - recorded: 29.43 Nutritional Status: BMI 25 -29 Overweight Nutritional Risks: None Diabetes: No  How often do you need to have someone help you when you read instructions, pamphlets, or other written materials from your doctor or pharmacy?: 1 - Never What is the last grade level you completed in school?: 14 yrs  Diabetic?No   Interpreter Needed?: No      Activities of Daily Living    04/03/2023   10:22 AM  In your present state of health, do you have any difficulty performing the following activities:  Hearing? 0  Vision? 0  Difficulty concentrating or making decisions? 0  Walking or climbing stairs? 0  Dressing or bathing? 0  Doing errands, shopping? 0  Preparing Food and eating ? N  Using the Toilet? N  In the past six months, have you accidently leaked urine? N  Do you have problems with loss of bowel control? N  Managing your Medications? N  Managing your Finances? N  Housekeeping or managing your Housekeeping? N    Patient Care Team: Chasty Randal, Donalee Citrin, NP as PCP - General (Family Medicine) Kathryne Hitch, MD as Consulting Physician (Orthopedic Surgery) Elmon Else, MD as Consulting Physician (Dermatology)  Indicate any recent Medical Services you may have received from other than [facility] providers in the past year (date may be approximate).     Assessment:   This is a routine wellness examination for [facility].  Hearing/Vision screen No results found.  Dietary issues and exercise activities discussed: Current Exercise Habits: Home exercise routine, Type of exercise: walking, Time (Minutes): 30, Frequency (Times/Week): 7, Weekly  Exercise (Minutes/Week): 210, Intensity: Mild, Exercise limited by: None identified   Goals Addressed             This Visit's Progress    Weight (lb) < 200 lb (90.7 kg)       Loss some weight        Depression Screen    04/03/2023   10:22 AM 07/15/2021    3:40 PM 10/01/2020    3:40 PM 06/22/2020    1:38 PM 11/04/2015   12:16 PM  PHQ 2/9 Scores  PHQ - 2 Score 0 0 0 0 0    Fall Risk    04/03/2023   10:22 AM 09/05/2022    1:46 PM 07/29/2022    1:06 PM 07/14/2021    3:42 PM 03/23/2021   12:13 PM  Fall Risk   Falls in the past year? 0 0 0 0 0  Number falls in past yr: 0 0 0 0 0  Injury with Fall? 0 0 0 0   Risk for fall due to : No Fall Risks No Fall Risks No Fall Risks No Fall Risks  Follow up  Falls evaluation completed Falls evaluation completed Falls evaluation completed     FALL RISK PREVENTION PERTAINING TO THE HOME:  Any stairs in or around the home? No  If so, are there any without handrails? No  Home free of loose throw rugs in walkways, pet beds, electrical cords, etc? Yes  Adequate lighting in your home to reduce risk of falls? Yes   ASSISTIVE DEVICES UTILIZED TO PREVENT FALLS:  Life alert? No  Use of a cane, walker or w/c? No  Grab bars in the bathroom? No  Shower chair or bench in shower? No  Elevated toilet seat or a handicapped toilet? No   TIMED UP AND GO:  Was the test performed? No .  Length of time to ambulate 10 feet: N/A sec.   Gait steady and fast without use of assistive device  Cognitive Function:        04/03/2023   10:23 AM  6CIT Screen  What Year? 0 points  What month? 0 points  What time? 0 points  Count back from 20 0 points  Months in reverse 0 points  Repeat phrase 0 points  Total Score 0 points    Immunizations Immunization History  Administered Date(s) Administered   PFIZER(Purple Top)SARS-COV-2 Vaccination 03/22/2020, 04/01/2020, 11/04/2020, 08/27/2021   PNEUMOCOCCAL CONJUGATE-20 07/29/2022   Tdap 08/20/2009    Zoster Recombinat (Shingrix) 12/16/2021, 03/18/2022    TDAP status: Due, Education has been provided regarding the importance of this vaccine. Advised may receive this vaccine at local pharmacy or Health Dept. Aware to provide a copy of the vaccination record if obtained from local pharmacy or Health Dept. Verbalized acceptance and understanding.  Flu Vaccine status: Up to date  Pneumococcal vaccine status: Up to date  Covid-19 vaccine status: Completed vaccines  Qualifies for Shingles Vaccine? Yes   Zostavax completed Yes   Shingrix Completed?: Yes  Screening Tests Health Maintenance  Topic Date Due   DTaP/Tdap/Td (2 - Td or Tdap) 08/21/2019   COLONOSCOPY (Pts 45-35yrs Insurance coverage will need to be confirmed)  01/03/2021   COVID-19 Vaccine (5 - 2023-24 season) 04/19/2023 (Originally 08/05/2022)   INFLUENZA VACCINE  07/06/2023   Medicare Annual Wellness (AWV)  04/02/2024   Pneumonia Vaccine 56+ Years old  Completed   Hepatitis C Screening  Completed   Zoster Vaccines- Shingrix  Completed   HPV VACCINES  Aged Out    Health Maintenance  Health Maintenance Due  Topic Date Due   DTaP/Tdap/Td (2 - Td or Tdap) 08/21/2019   COLONOSCOPY (Pts 45-53yrs Insurance coverage will need to be confirmed)  01/03/2021    Colorectal cancer screening: Type of screening: Colonoscopy. Completed 01/03/2011. Repeat every 10 years  Lung Cancer Screening: (Low Dose CT Chest recommended if Age 55-80 years, 30 pack-year currently smoking OR have quit w/in 15years.) does not qualify.   Lung Cancer Screening Referral: No   Additional Screening:  Hepatitis C Screening: does qualify; Completed Yes   Vision Screening: Recommended annual ophthalmology exams for early detection of glaucoma and other disorders of the eye. Is the patient up to date with their annual eye exam?  No  Who is the provider or what is the name of the office in which the patient attends annual eye exams? Will schedule  If pt  is not established with a provider, would they like to be referred to a provider to establish care? Yes .   Dental Screening: Recommended annual dental exams for proper oral hygiene  [employer]  Referral / Chronic Care Management: CRR required this visit?  No   CCM required this visit?  No      Plan:     I have personally reviewed and noted the following in the patient's chart:   Medical and social history Use of alcohol, tobacco or illicit drugs  Current medications and supplements including opioid prescriptions. Patient is not currently taking opioid prescriptions. Functional ability and status Nutritional status Physical activity Advanced directives List of other physicians Hospitalizations, surgeries, and ER visits in previous 12 months Vitals Screenings to include cognitive, depression, and falls Referrals and appointments  In addition, I have reviewed and discussed with patient certain preventive protocols, quality metrics, and best practice recommendations. A written personalized care plan for preventive services as well as general preventive health recommendations were provided to patient.  Nurse Notes: advised to get Tdap vaccine at the Pharmacy   This service is provided via telemedicine  No vital signs collected/recorded due to the encounter was a telemedicine visit.   Location of patient (ex: home, work):  Home   Patient consents to a telephone visit:  yes  Location of the provider (ex: office, home):  [facility] office   Name of any referring provider:  Richarda Blade ,FNP - [facility]  I connected with Marya Landry. [place] on 04/03/2023 by a video enabled telemedicine application and verified that I am speaking with the correct person using two identifiers.   I discussed the limitations of evaluation and management by telemedicine. The patient expressed understanding and agreed to proceed.   Caesar Bookman, NP   04/03/2023

## 2023-04-03 NOTE — Patient Instructions (Signed)
  Juan Powers , Thank you for taking time to come for your Medicare Wellness Visit. I appreciate your ongoing commitment to your health goals. Please review the following plan we discussed and let me know if I can assist you in the future.   These are the goals we discussed:  Goals   None     This is a list of the screening recommended for you and due dates:  Health Maintenance  Topic Date Due   Medicare Annual Wellness Visit  Never done   DTaP/Tdap/Td vaccine (2 - Td or Tdap) 08/21/2019   Colon Cancer Screening  01/03/2021   COVID-19 Vaccine (5 - 2023-24 season) 04/19/2023*   Flu Shot  07/06/2023   Pneumonia Vaccine  Completed   Hepatitis C Screening: USPSTF Recommendation to screen - Ages 18-79 yo.  Completed   Zoster (Shingles) Vaccine  Completed   HPV Vaccine  Aged Out  *Topic was postponed. The date shown is not the original due date.

## 2023-04-24 ENCOUNTER — Telehealth: Payer: Self-pay | Admitting: Orthopaedic Surgery

## 2023-04-24 NOTE — Telephone Encounter (Signed)
We haven't seen him since 09/2022, do we need to see him again or no?

## 2023-04-24 NOTE — Telephone Encounter (Signed)
Patient asking for someone to call and set him up for P/T with [facility] P/T

## 2023-05-05 ENCOUNTER — Other Ambulatory Visit: Payer: Self-pay

## 2023-05-05 ENCOUNTER — Telehealth: Payer: Self-pay | Admitting: Orthopaedic Surgery

## 2023-05-05 DIAGNOSIS — G8929 Other chronic pain: Secondary | ICD-10-CM

## 2023-05-05 NOTE — Telephone Encounter (Signed)
Patient asking what is going on with his referral for P/T.please call patient

## 2023-05-05 NOTE — Telephone Encounter (Signed)
Referral sent 

## 2023-06-26 ENCOUNTER — Other Ambulatory Visit: Payer: PPO

## 2023-07-03 ENCOUNTER — Encounter: Payer: Self-pay | Admitting: Family

## 2023-07-03 ENCOUNTER — Encounter: Payer: PPO | Admitting: Family

## 2023-07-03 NOTE — Progress Notes (Signed)
  This encounter was created in error - please disregard. No show 

## 2023-08-11 DIAGNOSIS — M545 Low back pain, unspecified: Secondary | ICD-10-CM | POA: Diagnosis not present

## 2023-08-11 DIAGNOSIS — G8929 Other chronic pain: Secondary | ICD-10-CM | POA: Diagnosis not present

## 2023-08-13 ENCOUNTER — Other Ambulatory Visit: Payer: Self-pay | Admitting: Family

## 2023-08-13 DIAGNOSIS — E782 Mixed hyperlipidemia: Secondary | ICD-10-CM

## 2023-08-21 DIAGNOSIS — L820 Inflamed seborrheic keratosis: Secondary | ICD-10-CM | POA: Diagnosis not present

## 2023-08-21 DIAGNOSIS — L570 Actinic keratosis: Secondary | ICD-10-CM | POA: Diagnosis not present

## 2023-08-21 DIAGNOSIS — C44519 Basal cell carcinoma of skin of other part of trunk: Secondary | ICD-10-CM | POA: Diagnosis not present

## 2023-08-21 DIAGNOSIS — D485 Neoplasm of uncertain behavior of skin: Secondary | ICD-10-CM | POA: Diagnosis not present

## 2023-08-21 DIAGNOSIS — L578 Other skin changes due to chronic exposure to nonionizing radiation: Secondary | ICD-10-CM | POA: Diagnosis not present

## 2023-08-21 DIAGNOSIS — D225 Melanocytic nevi of trunk: Secondary | ICD-10-CM | POA: Diagnosis not present

## 2023-08-21 DIAGNOSIS — Z85828 Personal history of other malignant neoplasm of skin: Secondary | ICD-10-CM | POA: Diagnosis not present

## 2023-08-21 DIAGNOSIS — L821 Other seborrheic keratosis: Secondary | ICD-10-CM | POA: Diagnosis not present

## 2023-09-13 ENCOUNTER — Other Ambulatory Visit: Payer: Self-pay | Admitting: Family

## 2023-09-13 DIAGNOSIS — E782 Mixed hyperlipidemia: Secondary | ICD-10-CM

## 2023-09-13 NOTE — Telephone Encounter (Signed)
Patient was only given 30 tablets for last refill.

## 2023-12-14 DIAGNOSIS — C44612 Basal cell carcinoma of skin of right upper limb, including shoulder: Secondary | ICD-10-CM | POA: Diagnosis not present

## 2024-01-10 ENCOUNTER — Other Ambulatory Visit: Payer: PPO

## 2024-01-12 ENCOUNTER — Encounter: Payer: PPO | Admitting: Family

## 2024-01-17 ENCOUNTER — Other Ambulatory Visit: Payer: PPO

## 2024-01-17 ENCOUNTER — Other Ambulatory Visit: Payer: Self-pay | Admitting: Family

## 2024-01-17 DIAGNOSIS — E782 Mixed hyperlipidemia: Secondary | ICD-10-CM

## 2024-01-17 DIAGNOSIS — R7303 Prediabetes: Secondary | ICD-10-CM

## 2024-01-18 LAB — COMPLETE METABOLIC PANEL WITH GFR
AG Ratio: 1.6 (calc) (ref 1.0–2.5)
ALT: 18 U/L (ref 9–46)
AST: 17 U/L (ref 10–35)
Albumin: 4 g/dL (ref 3.6–5.1)
Alkaline phosphatase (APISO): 63 U/L (ref 35–144)
BUN: 18 mg/dL (ref 7–25)
CO2: 24 mmol/L (ref 20–32)
Calcium: 9.1 mg/dL (ref 8.6–10.3)
Chloride: 106 mmol/L (ref 98–110)
Creat: 1.01 mg/dL (ref 0.70–1.35)
Globulin: 2.5 g/dL (ref 1.9–3.7)
Glucose, Bld: 123 mg/dL — ABNORMAL HIGH (ref 65–99)
Potassium: 4.5 mmol/L (ref 3.5–5.3)
Sodium: 138 mmol/L (ref 135–146)
Total Bilirubin: 0.5 mg/dL (ref 0.2–1.2)
Total Protein: 6.5 g/dL (ref 6.1–8.1)
eGFR: 82 mL/min/{1.73_m2} (ref >=60)

## 2024-01-18 LAB — CBC WITH DIFFERENTIAL/PLATELET
Absolute Lymphocytes: 1898 {cells}/uL (ref 850–3900)
Absolute Monocytes: 689 {cells}/uL (ref 200–950)
Basophils Absolute: 52 {cells}/uL (ref 0–200)
Basophils Relative: 0.8 %
Eosinophils Absolute: 91 {cells}/uL (ref 15–500)
Eosinophils Relative: 1.4 %
HCT: 47 % (ref 38.5–50.0)
Hemoglobin: 15.6 g/dL (ref 13.2–17.1)
MCH: 30.2 pg (ref 27.0–33.0)
MCHC: 33.2 g/dL (ref 32.0–36.0)
MCV: 91.1 fL (ref 80.0–100.0)
MPV: 9.3 fL (ref 7.5–12.5)
Monocytes Relative: 10.6 %
Neutro Abs: 3770 {cells}/uL (ref 1500–7800)
Neutrophils Relative %: 58 %
Platelets: 398 10*3/uL (ref 140–400)
RBC: 5.16 10*6/uL (ref 4.20–5.80)
RDW: 11.7 % (ref 11.0–15.0)
Total Lymphocyte: 29.2 %
WBC: 6.5 10*3/uL (ref 3.8–10.8)

## 2024-01-18 LAB — LIPID PANEL
Cholesterol: 119 mg/dL (ref <200)
HDL: 32 mg/dL — ABNORMAL LOW (ref >=40)
LDL Cholesterol (Calc): 69 mg/dL
Non-HDL Cholesterol (Calc): 87 mg/dL (ref <130)
Total CHOL/HDL Ratio: 3.7 (calc) (ref <5.0)
Triglycerides: 101 mg/dL (ref <150)

## 2024-01-18 LAB — HEMOGLOBIN A1C
Hgb A1c MFr Bld: 6.6 %{Hb} — ABNORMAL HIGH (ref <5.7)
Mean Plasma Glucose: 143 mg/dL
eAG (mmol/L): 7.9 mmol/L

## 2024-01-18 LAB — TSH: TSH: 1.64 m[IU]/L (ref 0.40–4.50)

## 2024-01-19 ENCOUNTER — Ambulatory Visit (INDEPENDENT_AMBULATORY_CARE_PROVIDER_SITE_OTHER): Payer: PPO | Admitting: Family

## 2024-01-19 ENCOUNTER — Encounter: Payer: Self-pay | Admitting: Family

## 2024-01-19 VITALS — BP 118/70 | HR 55 | Temp 97.9°F | Resp 20 | Ht 74.0 in | Wt 233.0 lb

## 2024-01-19 DIAGNOSIS — E782 Mixed hyperlipidemia: Secondary | ICD-10-CM | POA: Diagnosis not present

## 2024-01-19 DIAGNOSIS — R7303 Prediabetes: Secondary | ICD-10-CM

## 2024-01-19 DIAGNOSIS — H6123 Impacted cerumen, bilateral: Secondary | ICD-10-CM

## 2024-01-19 DIAGNOSIS — Z1211 Encounter for screening for malignant neoplasm of colon: Secondary | ICD-10-CM

## 2024-01-19 MED ORDER — DEBROX 6.5 % OT SOLN: 5.0000 [drp] | Freq: Two times a day (BID) | OTIC | 0 refills | Status: AC

## 2024-01-24 NOTE — Progress Notes (Signed)
 Provider: Richarda Blade FNP-C   Amory Simonetti, Donalee Citrin, NP  Patient Care Team: Parrish Bonn, Donalee Citrin, NP as PCP - General (Family Medicine) Kathryne Hitch, MD as Consulting Physician (Orthopedic Surgery) Elmon Else, MD as Consulting Physician (Dermatology)  Extended Emergency Contact Information Primary Emergency Contact: [facility] Address: [address]          [place], [place] [address] [place] of [place] Home Phone: [phone] Mobile Phone: [phone] Relation: Spouse  Code Status:  Full Code  Goals of care: Advanced Directive information    01/19/2024   11:36 AM  Advanced Directives  Does Patient Have a Medical Advance Directive? Yes  Type of Estate agent of [place];Living will  Does patient want to make changes to medical advance directive? No - Patient declined  Copy of Healthcare Power of Attorney in Chart? No - copy requested     Chief Complaint  Patient presents with   Annual Exam    Physical last one on 2023. Discuss the need for tdap, colonoscopy, covid and flu.    .   Discussed the use of AI scribe software for clinical note transcription with the patient, who gave verbal consent to proceed.  History of Present Illness   Juan Powers is a 68 year old male who presents for an annual physical exam.  He had the flu a couple of weeks ago, which lasted about a week, with symptoms including lack of appetite, diarrhea, and poor sleep. He experienced a fever of 101-102F for two to three days. Although he is now a week past the acute symptoms, he still has a lingering cough and low energy levels. No shortness of breath, wheezing, sore throat, or hoarseness.  Recent lab work indicates an increase in hemoglobin A1c from 5.6% a year ago to 6.6%, with a glucose level of 123 mg/dL. He has not had a recent colonoscopy or Cologuard test.  He weighs 233 pounds and feels he has gained a few pounds since his last visit. He engages in  physical activities such as riding a bicycle once or twice a week for about an hour and walking with his wife for 30-60 minutes. His diet includes some carbohydrates and occasional sugary foods, but he does not consume a large amount of sweets.  He is currently on atorvastatin for cholesterol, which has improved his total cholesterol and LDL levels. He does not take Celebrex regularly but uses it as needed for back pain, along with methocarbamol. He takes Zyrtec regularly but not Flonase or vitamin C.  He has not had a flu shot this year and last received a COVID-19 vaccine in September 2023. His last tetanus vaccine was in September 2010.    Past Medical History:  Diagnosis Date   Anemia    LATE TEENS EARLY 20S    Arthritis    RIGHT HIP    Basal cell carcinoma    MULTIPLE OVER THE YEARS   Hepatitis    HEP A IN THE EARLY 80s , TREATED AND RESOLVED    Tinnitus    Past Surgical History:  Procedure Laterality Date   COLONOSCOPY     TONSILLECTOMY     CHILDHOOD    TOTAL HIP ARTHROPLASTY Right 11/23/2018   Procedure: RIGHT TOTAL HIP ARTHROPLASTY ANTERIOR APPROACH;  Surgeon: Kathryne Hitch, MD;  Location: [facility];  Service: Orthopedics;  Laterality: Right;   TOTAL HIP ARTHROPLASTY Left 10/18/2019   Procedure: LEFT TOTAL HIP ARTHROPLASTY ANTERIOR APPROACH;  Surgeon: Doneen Poisson  Y, MD;  Location: [facility];  Service: Orthopedics;  Laterality: Left;    Allergies  Allergen Reactions   Other Shortness Of Breath and Swelling    Pan alba,   Swelling in throat and tounge    Allergies as of 01/19/2024       Reactions   Other Shortness Of Breath, Swelling   Pan alba,   Swelling in throat and tounge        Medication List        Accurate as of January 19, 2024 11:59 PM. If you have any questions, ask your nurse or doctor.          STOP taking these medications    fluticasone 50 MCG/ACT nasal spray Commonly known as: FLONASE Stopped by: Laurel Harnden C Elion Hocker        TAKE these medications    atorvastatin 10 MG tablet Commonly known as: LIPITOR TAKE 1 TABLET(10 MG) BY MOUTH DAILY   celecoxib 200 MG capsule Commonly known as: CELEBREX Take 1 capsule (200 mg total) by mouth 2 (two) times daily between meals as needed.   cetirizine 10 MG tablet Commonly known as: ZYRTEC Take 10 mg by mouth as needed for allergies.   Debrox 6.5 % OTIC solution Generic drug: carbamide peroxide Place 5 drops into both ears 2 (two) times daily for 4 days. Started by: Donalee Citrin Jasmin Winberry   ibuprofen 200 MG tablet Commonly known as: ADVIL Take 200 mg by mouth as needed for moderate pain or mild pain.   methocarbamol 500 MG tablet Commonly known as: ROBAXIN Take 1 tablet (500 mg total) by mouth every 6 (six) hours as needed.   MULTIVITAMIN ADULT PO Take 1 tablet by mouth daily.   vitamin C 1000 MG tablet Take 1,000 mg by mouth in the morning and at bedtime.        Review of Systems  Constitutional:  Negative for appetite change, chills, fatigue, fever and unexpected weight change.  HENT:  Negative for congestion, dental problem, ear discharge, ear pain, facial swelling, hearing loss, nosebleeds, postnasal drip, rhinorrhea, sinus pressure, sinus pain, sneezing, sore throat, tinnitus and trouble swallowing.   Eyes:  Negative for pain, discharge, redness, itching and visual disturbance.  Respiratory:  Negative for cough, chest tightness, shortness of breath and wheezing.   Cardiovascular:  Negative for chest pain, palpitations and leg swelling.  Gastrointestinal:  Negative for abdominal distention, abdominal pain, blood in stool, constipation, diarrhea, nausea and vomiting.  Endocrine: Negative for cold intolerance, heat intolerance, polydipsia, polyphagia and polyuria.  Genitourinary:  Negative for difficulty urinating, dysuria, flank pain, frequency and urgency.  Musculoskeletal:  Negative for arthralgias, back pain, gait problem, joint swelling, myalgias, neck  pain and neck stiffness.  Skin:  Negative for color change, pallor, rash and wound.  Neurological:  Negative for dizziness, syncope, speech difficulty, weakness, light-headedness, numbness and headaches.  Hematological:  Does not bruise/bleed easily.  Psychiatric/Behavioral:  Negative for agitation, behavioral problems, confusion, hallucinations, self-injury, sleep disturbance and suicidal ideas. The patient is not nervous/anxious.     Immunization History  Administered Date(s) Administered   PFIZER(Purple Top)SARS-COV-2 Vaccination 03/22/2020, 04/01/2020, 11/04/2020, 08/27/2021   PNEUMOCOCCAL CONJUGATE-20 07/29/2022   Tdap 08/20/2009   Zoster Recombinant(Shingrix) 12/16/2021, 03/18/2022   Pertinent  Health Maintenance Due  Topic Date Due   Colonoscopy  01/03/2021   INFLUENZA VACCINE  03/04/2024 (Originally 07/06/2023)      07/14/2021    3:42 PM 07/29/2022    1:06 PM 09/05/2022    1:46  PM 04/03/2023   10:22 AM 01/19/2024   11:35 AM  Fall Risk  Falls in the past year? 0 0 0 0 0  Was there an injury with Fall? 0 0 0 0 0  Fall Risk Category Calculator 0 0 0 0 0  Fall Risk Category (Retired) Low Low Low    (RETIRED) Patient Fall Risk Level Low fall risk Low fall risk Low fall risk    Patient at Risk for Falls Due to No Fall Risks No Fall Risks No Fall Risks No Fall Risks No Fall Risks  Fall risk Follow up Falls evaluation completed Falls evaluation completed Falls evaluation completed  Falls evaluation completed   Functional Status Survey:    Vitals:   01/19/24 0847  BP: 118/70  Pulse: (!) 55  Resp: 20  Temp: 97.9 F (36.6 C)  SpO2: 95%  Weight: 233 lb (105.7 kg)  Height: 6\' 2"  (1.88 m)   Body mass index is 29.92 kg/m. Physical Exam Vitals reviewed.  Constitutional:      General: He is not in acute distress.    Appearance: Normal appearance. He is overweight. He is not ill-appearing or diaphoretic.  HENT:     Head: Normocephalic.     Right Ear: There is impacted  cerumen.     Left Ear: There is impacted cerumen.     Nose: Nose normal. No congestion or rhinorrhea.     Mouth/Throat:     Mouth: Mucous membranes are moist.     Pharynx: Oropharynx is clear. No oropharyngeal exudate or posterior oropharyngeal erythema.  Eyes:     General: No scleral icterus.       Right eye: No discharge.        Left eye: No discharge.     Extraocular Movements: Extraocular movements intact.     Conjunctiva/sclera: Conjunctivae normal.     Pupils: Pupils are equal, round, and reactive to light.  Neck:     Vascular: No carotid bruit.  Cardiovascular:     Rate and Rhythm: Normal rate and regular rhythm.     Pulses: Normal pulses.     Heart sounds: Normal heart sounds. No murmur heard.    No friction rub. No gallop.  Pulmonary:     Effort: Pulmonary effort is normal. No respiratory distress.     Breath sounds: Normal breath sounds. No wheezing, rhonchi or rales.  Chest:     Chest wall: No tenderness.  Abdominal:     General: Bowel sounds are normal. There is no distension.     Palpations: Abdomen is soft. There is no mass.     Tenderness: There is no abdominal tenderness. There is no right CVA tenderness, left CVA tenderness, guarding or rebound.  Musculoskeletal:        General: No swelling or tenderness. Normal range of motion.     Cervical back: Normal range of motion. No rigidity or tenderness.     Right lower leg: No edema.     Left lower leg: No edema.  Lymphadenopathy:     Cervical: No cervical adenopathy.  Skin:    General: Skin is warm and dry.     Coloration: Skin is not pale.     Findings: No bruising, erythema, lesion or rash.  Neurological:     Mental Status: He is alert and oriented to person, place, and time.     Cranial Nerves: No cranial nerve deficit.     Sensory: No sensory deficit.     Motor: No  weakness.     Coordination: Coordination normal.     Gait: Gait normal.  Psychiatric:        Mood and Affect: Mood normal.        Speech:  Speech normal.        Behavior: Behavior normal.        Thought Content: Thought content normal.        Judgment: Judgment normal.     Labs reviewed: Recent Labs    01/17/24 0848  NA 138  K 4.5  CL 106  CO2 24  GLUCOSE 123*  BUN 18  CREATININE 1.01  CALCIUM 9.1   Recent Labs    01/17/24 0848  AST 17  ALT 18  BILITOT 0.5  PROT 6.5   Recent Labs    01/17/24 0848  WBC 6.5  NEUTROABS 3,770  HGB 15.6  HCT 47.0  MCV 91.1  PLT 398   Lab Results  Component Value Date   TSH 1.64 01/17/2024   Lab Results  Component Value Date   HGBA1C 6.6 (H) 01/17/2024   Lab Results  Component Value Date   CHOL 119 01/17/2024   HDL 32 (L) 01/17/2024   LDLCALC 69 01/17/2024   TRIG 101 01/17/2024   CHOLHDL 3.7 01/17/2024    Significant Diagnostic Results in last 30 days:  No results found.  Assessment/Plan  Diabetes Mellitus Type 2 Hemoglobin A1c increased from 5.6% to 6.6%, indicating a transition from prediabetes to diabetes. Glucose level was 123 mg/dL, above the normal range. Discussed the importance of diet and exercise in managing blood sugar levels, including monitoring carbohydrate intake and portion sizes. If A1c remains elevated, diabetes medication may be necessary, including the use of oral pills. - Continue regular exercise, including biking and walking. - Monitor and adjust diet to reduce carbohydrate intake and portion sizes. - Recheck Hemoglobin A1c in 4 months. - Schedule blood work in 4 months.  Hyperlipidemia Total cholesterol decreased from 136 mg/dL to 161 mg/dL, indicating effective management with atorvastatin. LDL cholesterol decreased from 80 mg/dL to 69 mg/dL. HDL cholesterol remains slightly low. Triglycerides increased from 85 mg/dL to 096 mg/dL but are within the normal range. Discussed the importance of continuing cardio exercises to improve HDL levels and prevent further increase in triglycerides. - Continue atorvastatin. - Continue cardio  exercises to improve HDL levels. - Lipid Panel; Future  Post-Influenza Recovery Recent episode of influenza with symptoms including fever, diarrhea, and cough. Symptoms have resolved except for a lingering cough and low energy levels. Vital signs: temperature 97.9F, oxygen level 95%, blood pressure 118/70 mmHg. Advised to increase fluid intake and rest for full recovery. - Increase fluid intake. - Continue to rest.   Prediabetes A1C has improved  - continue with dietary modification and exercise  - Hemoglobin A1c; Future - CBC with Differential/Platelet; Future - COMPLETE METABOLIC PANEL WITH GFR; Future   Encounter for screening colonoscopy Asymptomatic  - Ambulatory referral to Gastroenterology; Future  4. Bilateral impacted cerumen Bilateral ear cerumen impaction  - Advised to Instill debrox 6.5 otic solution 5 drops into each ear twice daily x 4 days then follow up for ear lavage.May apply cotton ball at bedtime to prevent drainage to pillow. - carbamide peroxide (DEBROX) 6.5 % OTIC solution; Place 5 drops into both ears 2 (two) times daily for 4 days.  Dispense: 2 mL; Refill: 0   Medication Management Reviewed current medications: atorvastatin, Celebrex, methocarbamol, ibuprofen, and Zyrtec. Discontinued Flonase as it is not being used. Discussed  the use of Celebrex and methocarbamol for episodic back pain and the importance of not combining Celebrex with ibuprofen. - Discontinue Flonase.  General Health Maintenance Reviewed immunization status and recommended updates. Discussed the need for routine screenings. Explained options for colorectal cancer screening, including Cologuard and colonoscopy, and the benefits and limitations of each. Patient opted for colonoscopy due to lower false positive rate and immediate polyp removal if needed. - Get a flu shot starting in October. - Get the latest COVID-19 vaccine at the pharmacy. - Get a tetanus vaccine at the pharmacy. -  Referral for colonoscopy.  Follow-up - Schedule follow-up appointment in 4 months for blood work and   Scientist, forensic Communication: Reviewed plan of care with patient verbalized understanding   Labs/tests ordered:  - Hemoglobin A1c; Future - CBC with Differential/Platelet; Future - COMPLETE METABOLIC PANEL WITH GFR; Future - Lipid Panel; Future  Next Appointment : Return in about 4 months (around 05/18/2024) for medical mangement of chronic issues., fasting labs prior to visit one week bilateral ear lavage .   Spent 30 minutes of Face to face and non-face to face with patient  >50% time spent counseling; reviewing medical record; tests; labs; documentation and developing future plan of care.   Caesar Bookman, NP

## 2024-01-26 ENCOUNTER — Encounter: Payer: Self-pay | Admitting: Adult Health

## 2024-01-26 ENCOUNTER — Ambulatory Visit: Payer: PPO | Admitting: Family

## 2024-01-26 ENCOUNTER — Ambulatory Visit (INDEPENDENT_AMBULATORY_CARE_PROVIDER_SITE_OTHER): Payer: PPO | Admitting: Adult Health

## 2024-01-26 VITALS — BP 120/80 | HR 74 | Temp 97.5°F | Resp 20 | Ht 74.0 in | Wt 232.0 lb

## 2024-01-26 DIAGNOSIS — E782 Mixed hyperlipidemia: Secondary | ICD-10-CM | POA: Diagnosis not present

## 2024-01-26 DIAGNOSIS — H6123 Impacted cerumen, bilateral: Secondary | ICD-10-CM

## 2024-01-26 DIAGNOSIS — R7303 Prediabetes: Secondary | ICD-10-CM | POA: Diagnosis not present

## 2024-01-26 NOTE — Progress Notes (Signed)
 [facility] clinic  Provider:  Kenard Gower DNP  Code Status:  Full Code  Goals of Care:     01/26/2024    3:29 PM  Advanced Directives  Does Patient Have a Medical Advance Directive? Yes  Type of Advance Directive Living will;Healthcare Power of Attorney  Copy of Healthcare Power of Attorney in Chart? No - copy requested     Chief Complaint  Patient presents with   Procedure    Bilateral ear lavage.   Discussed the use of AI scribe software for clinical note transcription with the patient, who gave verbal consent to proceed.  HPI: Patient is a 68 y.o. male seen today for an acute visit for bilateral ear lavage.  He completed Debrox for four days He has no ear pain and his hearing remains unaffected.  He experienced a respiratory flu two to three weeks ago, confirmed not to be COVID-19 through testing. He has residual shortness of breath, nasal congestion, and a cough, but otherwise feels okay. His stamina has not fully returned. No current runny nose or shortness of breath. No swelling in his feet.  He has a history of hyperlipidemia managed with atorvastatin, and his cholesterol levels are well-controlled.  He is prediabetic and plans to follow up in four months for blood work.  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 He has had both hips replaced in the past. He takes Celebrex and methocarbamol as needed for pain management. He has a history of working as a Proofreader, Music therapist, Presenter, broadcasting, which involved significant physical activity.     Past Medical History:  Diagnosis Date   Anemia    LATE TEENS EARLY 20S    Arthritis    RIGHT HIP    Basal cell carcinoma    MULTIPLE OVER THE YEARS   Hepatitis    HEP A IN THE EARLY 80s , TREATED AND RESOLVED    Tinnitus     Past Surgical History:  Procedure Laterality Date   COLONOSCOPY     TONSILLECTOMY     CHILDHOOD    TOTAL HIP ARTHROPLASTY Right  11/23/2018   Procedure: RIGHT TOTAL HIP ARTHROPLASTY ANTERIOR APPROACH;  Surgeon: Kathryne Hitch, MD;  Location: [facility];  Service: Orthopedics;  Laterality: Right;   TOTAL HIP ARTHROPLASTY Left 10/18/2019   Procedure: LEFT TOTAL HIP ARTHROPLASTY ANTERIOR APPROACH;  Surgeon: Kathryne Hitch, MD;  Location: [facility];  Service: Orthopedics;  Laterality: Left;    Allergies  Allergen Reactions   Other Shortness Of Breath and Swelling    Pan alba,   Swelling in throat and tounge    Outpatient Encounter Medications as of 01/26/2024  Medication Sig   atorvastatin (LIPITOR) 10 MG tablet TAKE 1 TABLET(10 MG) BY MOUTH DAILY   cetirizine (ZYRTEC) 10 MG tablet Take 10 mg by mouth as needed for allergies.   methocarbamol (ROBAXIN) 500 MG tablet Take 1 tablet (500 mg total) by mouth every 6 (six) hours as needed.   Multiple Vitamin (MULTIVITAMIN ADULT PO) Take 1 tablet by mouth daily.   Ascorbic Acid (VITAMIN C) 1000 MG tablet Take 1,000 mg by mouth in the morning and at bedtime. (Patient not taking: Reported on 01/26/2024)   celecoxib (CELEBREX) 200 MG capsule Take 1 capsule (200 mg total) by mouth 2 (two) times daily between meals as needed. (Patient not taking: Reported on 01/26/2024)   ibuprofen (ADVIL) 200 MG tablet Take 200 mg  by mouth as needed for moderate pain or mild pain. (Patient not taking: Reported on 01/26/2024)   No facility-administered encounter medications on file as of 01/26/2024.    Review of Systems:  Review of Systems  Constitutional:  Negative for activity change, appetite change and fever.  HENT:  Negative for sore throat.   Eyes: Negative.   Cardiovascular:  Negative for chest pain and leg swelling.  Gastrointestinal:  Negative for abdominal distention, diarrhea and vomiting.  Genitourinary:  Negative for dysuria, frequency and urgency.  Skin:  Negative for color change.  Neurological:  Negative for dizziness and headaches.  Psychiatric/Behavioral:  Negative  for behavioral problems and sleep disturbance. The patient is not nervous/anxious.     Health Maintenance  Topic Date Due   DTaP/Tdap/Td (2 - Td or Tdap) 08/21/2019   Colonoscopy  01/03/2021   COVID-19 Vaccine (5 - 2024-25 season) 08/06/2023   INFLUENZA VACCINE  03/04/2024 (Originally 07/06/2023)   Medicare Annual Wellness (AWV)  04/02/2024   Pneumonia Vaccine 65+ Years old  Completed   Hepatitis C Screening  Completed   Zoster Vaccines- Shingrix  Completed   HPV VACCINES  Aged Out    Physical Exam: Vitals:   01/26/24 1531  BP: 120/80  Pulse: 74  Resp: 20  Temp: (!) 97.5 F (36.4 C)  SpO2: 97%  Weight: 232 lb (105.2 kg)  Height: 6\' 2"  (1.88 m)   Body mass index is 29.79 kg/m. Physical Exam Constitutional:      Appearance: Normal appearance.  HENT:     Head: Normocephalic and atraumatic.     Mouth/Throat:     Mouth: Mucous membranes are moist.  Eyes:     Conjunctiva/sclera: Conjunctivae normal.  Cardiovascular:     Rate and Rhythm: Normal rate and regular rhythm.     Pulses: Normal pulses.     Heart sounds: Normal heart sounds.  Pulmonary:     Effort: Pulmonary effort is normal.     Breath sounds: Normal breath sounds.  Abdominal:     General: Bowel sounds are normal.     Palpations: Abdomen is soft.  Musculoskeletal:        General: No swelling. Normal range of motion.     Cervical back: Normal range of motion.  Skin:    General: Skin is warm and dry.  Neurological:     General: No focal deficit present.     Mental Status: He is alert and oriented to person, place, and time.  Psychiatric:        Mood and Affect: Mood normal.        Behavior: Behavior normal.        Thought Content: Thought content normal.        Judgment: Judgment normal.     Labs reviewed: Basic Metabolic Panel: Recent Labs    01/17/24 0848  NA 138  K 4.5  CL 106  CO2 24  GLUCOSE 123*  BUN 18  CREATININE 1.01  CALCIUM 9.1  TSH 1.64   Liver Function Tests: Recent Labs     01/17/24 0848  AST 17  ALT 18  BILITOT 0.5  PROT 6.5   No results for input(s): "LIPASE", "AMYLASE" in the last 8760 hours. No results for input(s): "AMMONIA" in the last 8760 hours. CBC: Recent Labs    01/17/24 0848  WBC 6.5  NEUTROABS 3,770  HGB 15.6  HCT 47.0  MCV 91.1  PLT 398   Lipid Panel: Recent Labs    01/17/24 0848  CHOL 119  HDL 32*  LDLCALC 69  TRIG 161  CHOLHDL 3.7   Lab Results  Component Value Date   HGBA1C 6.6 (H) 01/17/2024    Procedures since last visit: No results found.  Assessment/Plan  1. Bilateral impacted cerumen (Primary) -  Successful ear lavage performed today with significant cerumen removal. No reported hearing loss or ear pain.    2. Mixed hyperlipidemia Lab Results  Component Value Date   CHOL 119 01/17/2024   HDL 32 (L) 01/17/2024   LDLCALC 69 01/17/2024   TRIG 101 01/17/2024   CHOLHDL 3.7 01/17/2024    -  Patient is compliant with Atorvastatin. No reported side effects.   -Continue Atorvastatin.  3. Prediabetes Lab Results  Component Value Date   HGBA1C 6.6 (H) 01/17/2024    -  Patient acknowledges need for dietary adjustments, specifically reducing sugar intake.   -Continue dietary modifications.       Labs/tests ordered:   None   Kalii Chesmore Medina-Vargas, NP

## 2024-02-16 ENCOUNTER — Encounter: Payer: Self-pay | Admitting: Gastroenterology

## 2024-02-19 DIAGNOSIS — L821 Other seborrheic keratosis: Secondary | ICD-10-CM | POA: Diagnosis not present

## 2024-02-19 DIAGNOSIS — L570 Actinic keratosis: Secondary | ICD-10-CM | POA: Diagnosis not present

## 2024-02-19 DIAGNOSIS — Z85828 Personal history of other malignant neoplasm of skin: Secondary | ICD-10-CM | POA: Diagnosis not present

## 2024-02-19 DIAGNOSIS — D225 Melanocytic nevi of trunk: Secondary | ICD-10-CM | POA: Diagnosis not present

## 2024-02-19 DIAGNOSIS — L578 Other skin changes due to chronic exposure to nonionizing radiation: Secondary | ICD-10-CM | POA: Diagnosis not present

## 2024-03-25 ENCOUNTER — Ambulatory Visit: Payer: Self-pay

## 2024-03-25 ENCOUNTER — Ambulatory Visit (AMBULATORY_SURGERY_CENTER): Admitting: *Deleted

## 2024-03-25 VITALS — Ht 74.0 in | Wt 220.0 lb

## 2024-03-25 DIAGNOSIS — Z1211 Encounter for screening for malignant neoplasm of colon: Secondary | ICD-10-CM

## 2024-03-25 MED ORDER — NA SULFATE-K SULFATE-MG SULF 17.5-3.13-1.6 GM/177ML PO SOLN: 1.0000 | Freq: Once | ORAL | 0 refills | Status: AC

## 2024-03-25 NOTE — Telephone Encounter (Signed)
  Pt called to have pharmacy updated to: [facility].             Copied from CRM [id number]. Topic: Clinical - Prescription Issue >> Mar 25, 2024 10:32 AM Hamdi H wrote: Reason for CRM: Patient wants his preferred pharmacy to be changed to [facility] on [address].

## 2024-03-25 NOTE — Progress Notes (Signed)
 Pre visit completed over telephone Instructions forwarded trough secure email  [email or web address]   No egg or soy allergy known to patient  No issues known to pt with past sedation with any surgeries or procedures Patient denies ever being told they had issues or difficulty with intubation  No FH of Malignant Hyperthermia Pt is not on diet pills Pt is not on  home 02  Pt is not on blood thinners  Pt denies issues with constipation  No A fib or A flutter Have any cardiac testing pending--NO Pt instructed to use [email or web address] or GoodRx for a price reduction on prep    Patient downloaded suprep coupon while on the phone

## 2024-03-25 NOTE — Telephone Encounter (Signed)
 Pt called to have pharmacy updated to: [facility] at [facility] [address] [place]  [address]          Copied from CRM [id number]. Topic: Clinical - Prescription Issue >> Mar 25, 2024 10:32 AM Juan Powers wrote: Reason for CRM: Patient wants his preferred pharmacy to be changed to [facility] on [address].

## 2024-03-29 ENCOUNTER — Encounter: Payer: Self-pay | Admitting: Gastroenterology

## 2024-04-02 ENCOUNTER — Other Ambulatory Visit: Payer: Self-pay | Admitting: Family

## 2024-04-02 DIAGNOSIS — E782 Mixed hyperlipidemia: Secondary | ICD-10-CM

## 2024-04-05 ENCOUNTER — Ambulatory Visit (AMBULATORY_SURGERY_CENTER): Admitting: Gastroenterology

## 2024-04-05 ENCOUNTER — Encounter: Payer: Self-pay | Admitting: Gastroenterology

## 2024-04-05 VITALS — BP 98/67 | HR 59 | Temp 97.9°F | Resp 12 | Ht 74.0 in | Wt 220.0 lb

## 2024-04-05 DIAGNOSIS — D122 Benign neoplasm of ascending colon: Secondary | ICD-10-CM | POA: Diagnosis not present

## 2024-04-05 DIAGNOSIS — D128 Benign neoplasm of rectum: Secondary | ICD-10-CM | POA: Diagnosis not present

## 2024-04-05 DIAGNOSIS — K635 Polyp of colon: Secondary | ICD-10-CM

## 2024-04-05 DIAGNOSIS — K621 Rectal polyp: Secondary | ICD-10-CM | POA: Diagnosis not present

## 2024-04-05 DIAGNOSIS — Z1211 Encounter for screening for malignant neoplasm of colon: Secondary | ICD-10-CM

## 2024-04-05 DIAGNOSIS — D123 Benign neoplasm of transverse colon: Secondary | ICD-10-CM

## 2024-04-05 MED ORDER — SODIUM CHLORIDE 0.9 % IV SOLN: 500.0000 mL | Freq: Once | INTRAVENOUS | Status: DC

## 2024-04-05 NOTE — Progress Notes (Signed)
 [facility] History and Physical   Primary Care Physician:  Ngetich, Elijio Guadel[place], NP   Reason for Procedure:   Colon cancer screening  Plan:    Screening colonoscopy     HPI: Juan Powers is a 68 y.o. male undergoing average risk screening colonoscopy.  He has no family history of colon cancer and no chronic GI symptoms.  He reports having a normal colonoscopy about 10 years ago with [facility].   Past Medical History:  Diagnosis Date   Anemia    LATE TEENS EARLY 20S    Arthritis    RIGHT HIP    Basal cell carcinoma    MULTIPLE OVER THE YEARS   Hepatitis    HEP A IN THE EARLY 80s , TREATED AND RESOLVED    Hyperlipidemia    Tinnitus     Past Surgical History:  Procedure Laterality Date   COLONOSCOPY     TONSILLECTOMY     CHILDHOOD    TOTAL HIP ARTHROPLASTY Right 11/23/2018   Procedure: RIGHT TOTAL HIP ARTHROPLASTY ANTERIOR APPROACH;  Surgeon: Arnie Lao, MD;  Location: [facility];  Service: Orthopedics;  Laterality: Right;   TOTAL HIP ARTHROPLASTY Left 10/18/2019   Procedure: LEFT TOTAL HIP ARTHROPLASTY ANTERIOR APPROACH;  Surgeon: Arnie Lao, MD;  Location: [facility];  Service: Orthopedics;  Laterality: Left;    Prior to Admission medications   Medication Sig Start Date End Date Taking? Authorizing Provider  atorvastatin  (LIPITOR) 10 MG tablet TAKE 1 TABLET(10 MG) BY MOUTH DAILY 04/02/24  Yes Ngetich, Dinah C, NP  cetirizine (ZYRTEC) 10 MG tablet Take 10 mg by mouth as needed for allergies.   Yes [provider]  ibuprofen (ADVIL) 200 MG tablet Take 200 mg by mouth as needed for moderate pain (pain score 4-6) or mild pain (pain score 1-3).   Yes [provider]  methocarbamol  (ROBAXIN ) 500 MG tablet Take 1 tablet (500 mg total) by mouth every 6 (six) hours as needed. 09/22/22  Yes Arnie Lao, MD  celecoxib  (CELEBREX ) 200 MG capsule Take 1 capsule (200 mg total) by mouth 2 (two) times daily between meals as  needed. 09/22/22   Arnie Lao, MD  Multiple Vitamin (MULTIVITAMIN ADULT PO) Take 1 tablet by mouth daily. Patient not taking: Reported on 04/05/2024    [provider]    Current Outpatient Medications  Medication Sig Dispense Refill   atorvastatin  (LIPITOR) 10 MG tablet TAKE 1 TABLET(10 MG) BY MOUTH DAILY 90 tablet 1   cetirizine (ZYRTEC) 10 MG tablet Take 10 mg by mouth as needed for allergies.     ibuprofen (ADVIL) 200 MG tablet Take 200 mg by mouth as needed for moderate pain (pain score 4-6) or mild pain (pain score 1-3).     methocarbamol  (ROBAXIN ) 500 MG tablet Take 1 tablet (500 mg total) by mouth every 6 (six) hours as needed. 40 tablet 1   celecoxib  (CELEBREX ) 200 MG capsule Take 1 capsule (200 mg total) by mouth 2 (two) times daily between meals as needed. 60 capsule 1   Multiple Vitamin (MULTIVITAMIN ADULT PO) Take 1 tablet by mouth daily. (Patient not taking: Reported on 04/05/2024)     Current Facility-Administered Medications  Medication Dose Route Frequency Provider Last Rate Last Admin   0.9 %  sodium chloride  infusion  500 mL Intravenous Once Elois Hair, MD        Allergies as of 04/05/2024 - Review Complete 04/05/2024  Allergen Reaction Noted  Other Shortness Of Breath and Swelling 11/04/2015    Family History  Problem Relation Age of Onset   Heart disease Father 76   Lung cancer Father    Breast cancer Sister 72   Colon polyps Neg Hx    Colon cancer Neg Hx     Social History   Socioeconomic History   Marital status: Married    Spouse name: Not on file   Number of children: Not on file   Years of education: Not on file   Highest education level: Not on file  Occupational History   Occupation: Carpenter     Comment: [employer]   Tobacco Use   Smoking status: Former    Current packs/day: 0.00    Average packs/day: 1 pack/day for 20.0 years (20.0 ttl pk-yrs)    Types: Cigarettes    Start date: 52    Quit date:  2000    Years since quitting: 25.3   Smokeless tobacco: Never  Vaping Use   Vaping status: Never Used  Substance and Sexual Activity   Alcohol use: Yes    Alcohol/week: 1.0 - 2.0 standard drink of alcohol    Types: 1 - 2 Standard drinks or equivalent per week    Comment: OCC   Drug use: Not Currently    Types: Marijuana    Comment: YEARS    Sexual activity: Not on file  Other Topics Concern   Not on file  Social History Narrative   Diet:       Do you drink/ eat things with caffeine?  Coffee       Marital status:    Married                           What year were you married ?  1982      Do you live in a house, apartment,assistred living, condo, trailer, etc.)?  House      Is it one or more stories?   1      How many persons live in your home ?   2      Do you have any pets in your home ?(please list)   None      Highest Level of education completed:  B.S.      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                              Type & how often       ADVANCED DIRECTIVES (Please bring copies)      Do you have a living will?       Do you have a DNR form?     No                  If not, do you want to discuss one?  No      Do you have signed POA?HPOA forms?                 If so, please bring to your appointment      FUNCTIONAL STATUS- To be completed by Spouse / child / Staff       Do you have difficulty bathing or dressing yourself ?  No      Do you have difficulty preparing food or eating ?  No  Do you have difficulty managing your mediation ?  No      Do you have difficulty managing your finances ?   No      Do you have difficulty affording your medication ?   No      Social Drivers of Corporate investment banker Strain: Not on file  Food Insecurity: Not on file  Transportation Needs: Not on file  Physical Activity: Not on file  Stress: Not on file  Social Connections: Not on file  Intimate Partner Violence: Not on file     Review of Systems:  All other review of systems negative except as mentioned in the HPI.  Physical Exam: Vital signs BP 111/68   Pulse 60   Temp 97.9 F (36.6 C) (Skin)   Ht 6\' 2"  (1.88 m)   Wt 220 lb (99.8 kg)   SpO2 97%   BMI 28.25 kg/m   General:   Alert,  Well-developed, well-nourished, pleasant and cooperative in NAD Airway:  Mallampati 1 Lungs:  Clear throughout to auscultation.   Heart:  Regular rate and rhythm; no murmurs, clicks, rubs,  or gallops. Abdomen:  Soft, nontender and nondistended. Normal bowel sounds.   Neuro/Psych:  Normal mood and affect. A and O x 3   Jeancarlos Marchena E. Cherryl Corona, MD [facility]

## 2024-04-05 NOTE — Patient Instructions (Signed)

## 2024-04-05 NOTE — Progress Notes (Signed)
 Pt's states no medical or surgical changes since previsit or office visit.

## 2024-04-05 NOTE — Progress Notes (Signed)
 Called to room to assist during endoscopic procedure.  Patient ID and intended procedure confirmed with present staff. Received instructions for my participation in the procedure from the performing physician.

## 2024-04-05 NOTE — Progress Notes (Signed)
 Sedate, gd SR, tolerated procedure well, VSS, report to RN

## 2024-04-05 NOTE — Op Note (Signed)
 [name]: Juan Powers Procedure Date: 04/05/2024 9:30 AM MRN: [id number] Endoscopist: Geralyn Knee E. Cherryl Corona , MD, [id number] Age: 68 Referring MD:  Date of Birth: [birth year] Gender: Male Account #: [id number] Procedure:                Colonoscopy Indications:              Screening for colorectal malignant neoplasm; he had                            a reportedly normal colonoscopy about 10 years ago                            (records not available) Medicines:                Monitored Anesthesia Care Procedure:                Pre-Anesthesia Assessment:                           - Prior to the procedure, a History and Physical                            was performed, and patient medications and                            allergies were reviewed. The patient's tolerance of                            previous anesthesia was also reviewed. The risks                            and benefits of the procedure and the sedation                            options and risks were discussed with the patient.                            All questions were answered, and informed consent                            was obtained. Prior Anticoagulants: The patient has                            taken no anticoagulant Juan antiplatelet agents. ASA                            Grade Assessment: II - A patient with mild systemic                            disease. After reviewing the risks and benefits,                            the patient was deemed in satisfactory condition to  undergo the procedure.                           After obtaining informed consent, the colonoscope                            was passed under direct vision. Throughout the                            procedure, the patient's blood pressure, pulse, and                            oxygen saturations were monitored continuously. The                            Olympus CF-HQ190L ([id number])  Colonoscope was                            introduced through the anus and advanced to the the                            terminal ileum, with identification of the                            appendiceal orifice and IC valve. The colonoscopy                            was performed without difficulty. The patient                            tolerated the procedure well. The quality of the                            bowel preparation was excellent. The terminal                            ileum, ileocecal valve, appendiceal orifice, and                            rectum were photographed. The bowel preparation                            used was SUPREP via split dose instruction. Scope In: 9:39:36 AM Scope Out: 9:55:33 AM Scope Withdrawal Time: 0 hours 13 minutes 7 seconds  Total Procedure Duration: 0 hours 15 minutes 57 seconds  Findings:                 The perianal and digital rectal examinations were                            normal. Pertinent negatives include normal                            sphincter tone and no palpable rectal lesions.  A 4 mm polyp was found in the ascending colon. The                            polyp was sessile. The polyp was removed with a                            cold snare. Resection and retrieval were complete.                            Estimated blood loss was minimal.                           A 5 mm polyp was found in the transverse colon. The                            polyp was sessile. The polyp was removed with a                            cold snare. Resection and retrieval were complete.                            Estimated blood loss was minimal.                           A 3 mm polyp was found in the rectum. The polyp was                            sessile. The polyp was removed with a cold snare.                            Resection and retrieval were complete. Estimated                            blood loss was  minimal.                           The exam was otherwise normal throughout the                            examined colon.                           The terminal ileum appeared normal.                           The retroflexed view of the distal rectum and anal                            verge was normal and showed no anal Juan rectal                            abnormalities. Complications:            No immediate complications. Estimated Blood  Loss:     Estimated blood loss was minimal. Impression:               - One 4 mm polyp in the ascending colon, removed                            with a cold snare. Resected and retrieved.                           - One 5 mm polyp in the transverse colon, removed                            with a cold snare. Resected and retrieved.                           - One 3 mm polyp in the rectum, removed with a cold                            snare. Resected and retrieved.                           - The examined portion of the ileum was normal.                           - The distal rectum and anal verge are normal on                            retroflexion view. Recommendation:           - Patient has a contact number available for                            emergencies. The signs and symptoms of potential                            delayed complications were discussed with the                            patient. Return to normal activities tomorrow.                            Written discharge instructions were provided to the                            patient.                           - Resume previous diet.                           - Continue present medications.                           - Await pathology results.                           -  Repeat colonoscopy (date not yet determined) for                            surveillance based on pathology results. Enez Monahan E. Cherryl Corona, MD 04/05/2024 10:03:14 AM This report has been signed electronically.

## 2024-04-08 ENCOUNTER — Telehealth: Payer: Self-pay | Admitting: *Deleted

## 2024-04-08 ENCOUNTER — Encounter: Payer: PPO | Admitting: Adult Health

## 2024-04-08 NOTE — Telephone Encounter (Signed)
 Post procedure follow up call placed, no answer and left VM.

## 2024-04-09 LAB — SURGICAL PATHOLOGY

## 2024-04-13 ENCOUNTER — Encounter: Payer: Self-pay | Admitting: Gastroenterology

## 2024-05-06 ENCOUNTER — Encounter: Payer: Self-pay | Admitting: Family

## 2024-05-06 ENCOUNTER — Ambulatory Visit: Payer: PPO | Admitting: Family

## 2024-05-06 VITALS — Ht 74.0 in | Wt 220.0 lb

## 2024-05-06 DIAGNOSIS — Z0000 Encounter for general adult medical examination without abnormal findings: Secondary | ICD-10-CM

## 2024-05-06 NOTE — Patient Instructions (Signed)
 Juan Powers , Thank you for taking time to come for your Medicare Wellness Visit. I appreciate your ongoing commitment to your health goals. Please review the following plan we discussed and let me know if I can assist you in the future.   Screening recommendations/referrals: Colonoscopy : Up to date  Recommended yearly ophthalmology/optometry visit for glaucoma screening and checkup Recommended yearly dental visit for hygiene and checkup  Vaccinations: Influenza vaccine due annually in September/October Pneumococcal vaccine : Up to date  Tdap vaccine : Please get Tetanus vaccine at the Pharmacy  Shingles vaccine : Up to date     Advanced directives: yes   Conditions/risks identified: advanced age (>44men, >54 women)  Next appointment: 1 year   Preventive Care 24 Years and Older, Male Preventive care refers to lifestyle choices and visits with your health care provider that can promote health and wellness. What does preventive care include? A yearly physical exam. This is also called an annual well check. Dental exams once or twice a year. Routine eye exams. Ask your health care provider how often you should have your eyes checked. Personal lifestyle choices, including: Daily care of your teeth and gums. Regular physical activity. Eating a healthy diet. Avoiding tobacco and drug use. Limiting alcohol use. Practicing safe sex. Taking low doses of aspirin  every day. Taking vitamin and mineral supplements as recommended by your health care provider. What happens during an annual well check? The services and screenings done by your health care provider during your annual well check will depend on your age, overall health, lifestyle risk factors, and family history of disease. Counseling  Your health care provider may ask you questions about your: Alcohol use. Tobacco use. Drug use. Emotional well-being. Home and relationship well-being. Sexual activity. Eating habits. History  of falls. Memory and ability to understand (cognition). Work and work Astronomer. Screening  You may have the following tests or measurements: Height, weight, and BMI. Blood pressure. Lipid and cholesterol levels. These may be checked every 5 years, or more frequently if you are over 44 years old. Skin check. Lung cancer screening. You may have this screening every year starting at age 72 if you have a 30-pack-year history of smoking and currently smoke or have quit within the past 15 years. Fecal occult blood test (FOBT) of the stool. You may have this test every year starting at age 68. Flexible sigmoidoscopy or colonoscopy. You may have a sigmoidoscopy every 5 years or a colonoscopy every 10 years starting at age 39. Prostate cancer screening. Recommendations will vary depending on your family history and other risks. Hepatitis C blood test. Hepatitis B blood test. Sexually transmitted disease (STD) testing. Diabetes screening. This is done by checking your blood sugar (glucose) after you have not eaten for a while (fasting). You may have this done every 1-3 years. Abdominal aortic aneurysm (AAA) screening. You may need this if you are a current or former smoker. Osteoporosis. You may be screened starting at age 29 if you are at high risk. Talk with your health care provider about your test results, treatment options, and if necessary, the need for more tests. Vaccines  Your health care provider may recommend certain vaccines, such as: Influenza vaccine. This is recommended every year. Tetanus, diphtheria, and acellular pertussis (Tdap, Td) vaccine. You may need a Td booster every 10 years. Zoster vaccine. You may need this after age 85. Pneumococcal 13-valent conjugate (PCV13) vaccine. One dose is recommended after age 83. Pneumococcal polysaccharide (PPSV23) vaccine. One  dose is recommended after age 69. Talk to your health care provider about which screenings and vaccines you need  and how often you need them. This information is not intended to replace advice given to you by your health care provider. Make sure you discuss any questions you have with your health care provider. Document Released: 12/18/2015 Document Revised: 08/10/2016 Document Reviewed: 09/22/2015 Elsevier Interactive Patient Education  2017 [employer].  Fall Prevention in the Home Falls can cause injuries. They can happen to people of all ages. There are many things you can do to make your home safe and to help prevent falls. What can I do on the outside of my home? Regularly fix the edges of walkways and driveways and fix any cracks. Remove anything that might make you trip as you walk through a door, such as a raised step or threshold. Trim any bushes or trees on the path to your home. Use bright outdoor lighting. Clear any walking paths of anything that might make someone trip, such as rocks or tools. Regularly check to see if handrails are loose or broken. Make sure that both sides of any steps have handrails. Any raised decks and porches should have guardrails on the edges. Have any leaves, snow, or ice cleared regularly. Use sand or salt on walking paths during winter. Clean up any spills in your garage right away. This includes oil or grease spills. What can I do in the bathroom? Use night lights. Install grab bars by the toilet and in the tub and shower. Do not use towel bars as grab bars. Use non-skid mats or decals in the tub or shower. If you need to sit down in the shower, use a plastic, non-slip stool. Keep the floor dry. Clean up any water  that spills on the floor as soon as it happens. Remove soap buildup in the tub or shower regularly. Attach bath mats securely with double-sided non-slip rug tape. Do not have throw rugs and other things on the floor that can make you trip. What can I do in the bedroom? Use night lights. Make sure that you have a light by your bed that is easy  to reach. Do not use any sheets or blankets that are too big for your bed. They should not hang down onto the floor. Have a firm chair that has side arms. You can use this for support while you get dressed. Do not have throw rugs and other things on the floor that can make you trip. What can I do in the kitchen? Clean up any spills right away. Avoid walking on wet floors. Keep items that you use a lot in easy-to-reach places. If you need to reach something above you, use a strong step stool that has a grab bar. Keep electrical cords out of the way. Do not use floor polish or wax that makes floors slippery. If you must use wax, use non-skid floor wax. Do not have throw rugs and other things on the floor that can make you trip. What can I do with my stairs? Do not leave any items on the stairs. Make sure that there are handrails on both sides of the stairs and use them. Fix handrails that are broken or loose. Make sure that handrails are as long as the stairways. Check any carpeting to make sure that it is firmly attached to the stairs. Fix any carpet that is loose or worn. Avoid having throw rugs at the top or bottom of the  stairs. If you do have throw rugs, attach them to the floor with carpet tape. Make sure that you have a light switch at the top of the stairs and the bottom of the stairs. If you do not have them, ask someone to add them for you. What else can I do to help prevent falls? Wear shoes that: Do not have high heels. Have rubber bottoms. Are comfortable and fit you well. Are closed at the toe. Do not wear sandals. If you use a stepladder: Make sure that it is fully opened. Do not climb a closed stepladder. Make sure that both sides of the stepladder are locked into place. Ask someone to hold it for you, if possible. Clearly mark and make sure that you can see: Any grab bars or handrails. First and last steps. Where the edge of each step is. Use tools that help you move  around (mobility aids) if they are needed. These include: Canes. Walkers. Scooters. Crutches. Turn on the lights when you go into a dark area. Replace any light bulbs as soon as they burn out. Set up your furniture so you have a clear path. Avoid moving your furniture around. If any of your floors are uneven, fix them. If there are any pets around you, be aware of where they are. Review your medicines with your doctor. Some medicines can make you feel dizzy. This can increase your chance of falling. Ask your doctor what other things that you can do to help prevent falls. This information is not intended to replace advice given to you by your health care provider. Make sure you discuss any questions you have with your health care provider. Document Released: 09/17/2009 Document Revised: 04/28/2016 Document Reviewed: 12/26/2014 Elsevier Interactive Patient Education  2017 [employer].

## 2024-05-06 NOTE — Progress Notes (Addendum)
 Subjective:   Juan Powers is a 68 y.o. male who presents for Medicare Annual/Subsequent preventive examination.  Visit Complete: Video   Patient Medicare AWV questionnaire was completed by the patient on 05/06/2024; I have confirmed that all information answered by patient is correct and no changes since this date.  Cardiac Risk Factors include: advanced age (>54men, >33 women)     Objective:     Today's Vitals   05/06/24 1254  Weight: 220 lb (99.8 kg)  Height: 6' 2 (1.88 m)   Body mass index is 28.25 kg/m.     05/06/2024    1:01 PM 01/26/2024    3:29 PM 01/19/2024   11:36 AM 04/03/2023   10:22 AM 07/29/2022    1:06 PM 07/14/2021    3:42 PM 10/01/2020    3:40 PM  Advanced Directives  Does Patient Have a Medical Advance Directive? Yes Yes Yes Yes No No Yes  Type of Estate agent of [place];Living will Living will;Healthcare Power of [employer] Power of [place] [employer];Living will Healthcare Power of [place];Living will   Healthcare Power of [place];Living will  Does patient want to make changes to medical advance directive?   No - Patient declined No - Patient declined   No - Patient declined  Copy of Healthcare Power of Attorney in Chart? No - copy requested No - copy requested No - copy requested No - copy requested   Yes - validated most recent copy scanned in chart (See row information)  Would patient like information on creating a medical advance directive?     No - Patient declined No - Patient declined     Current Medications (verified) Outpatient Encounter Medications as of 05/06/2024  Medication Sig   atorvastatin  (LIPITOR) 10 MG tablet TAKE 1 TABLET(10 MG) BY MOUTH DAILY   celecoxib  (CELEBREX ) 200 MG capsule Take 1 capsule (200 mg total) by mouth 2 (two) times daily between meals as needed.   cetirizine (ZYRTEC) 10 MG tablet Take 10 mg by mouth as needed for allergies.   ibuprofen (ADVIL) 200 MG tablet Take 200 mg by mouth as needed for  moderate pain (pain score 4-6) or mild pain (pain score 1-3).   methocarbamol  (ROBAXIN ) 500 MG tablet Take 1 tablet (500 mg total) by mouth every 6 (six) hours as needed.   Multiple Vitamin (MULTIVITAMIN ADULT PO) Take 1 tablet by mouth daily. (Patient not taking: Reported on 05/06/2024)   No facility-administered encounter medications on file as of 05/06/2024.    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   Procedure: RIGHT TOTAL HIP ARTHROPLASTY ANTERIOR APPROACH;  Surgeon: Arnie Lao, MD;  Location: [facility];  Service: Orthopedics;  Laterality: Right;   TOTAL HIP ARTHROPLASTY Left 10/18/2019   Procedure: LEFT TOTAL HIP ARTHROPLASTY ANTERIOR APPROACH;  Surgeon: Arnie Lao, MD;  Location: [facility];  Service: Orthopedics;  Laterality: Left;   Family History  Problem Relation Age of Onset   Heart  disease Father 59   Lung cancer Father    Breast cancer Sister 36   Colon polyps Neg Hx    Colon cancer Neg Hx    Social History   Socioeconomic History   Marital status: Married    Spouse name: Not on file   Number of children: Not on file   Years of education: Not on file   Highest education level: Not on file  Occupational History   Occupation: Carpenter     Comment: [employer]   Tobacco Use   Smoking status: Former    Current packs/day: 0.00    Average packs/day: 1 pack/day for 20.0 years (20.0 ttl pk-yrs)    Types: Cigarettes    Start date: 54    Quit date: 2000    Years since quitting: 25.4   Smokeless tobacco: Never  Vaping Use   Vaping status: Never Used   Substance and Sexual Activity   Alcohol use: Yes    Alcohol/week: 1.0 - 2.0 standard drink of alcohol    Types: 1 - 2 Standard drinks or equivalent per week    Comment: [facility]   Drug use: Not Currently    Types: Marijuana    Comment: YEARS    Sexual activity: Not on file  Other Topics Concern   Not on file  Social History Narrative   Diet:       Do you drink/ eat things with caffeine?  Coffee       Marital status:    Married                           What year were you married ?  1982      Do you live in a house, apartment,assistred living, condo, trailer, etc.)?  House      Is it one or more stories?   1      How many persons live in your home ?   2      Do you have any pets in your home ?(please list)   None      Highest Level of education completed:  B.S.      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                              Type & how often       ADVANCED DIRECTIVES (Please bring copies)      Do you have a living will?       Do you have a DNR form?     No                  If not, do you want to discuss one?  No      Do you have signed POA?HPOA forms?                 If so, please bring to your appointment      FUNCTIONAL STATUS- To be completed by Spouse / child / Staff       Do you have difficulty bathing or dressing yourself ?  No      Do you have difficulty preparing food or eating ?  No      Do you have difficulty managing your mediation ?  No      Do you have difficulty  managing your finances ?   No      Do you have difficulty affording your medication ?   No      Social Drivers of Corporate investment banker Strain: Low Risk  (05/06/2024)   Overall Financial Resource Strain (CARDIA)    Difficulty of Paying Living Expenses: Not hard at all  Food Insecurity: No Food Insecurity (05/06/2024)   Hunger Vital Sign    Worried About Running Out of Food in the Last Year: Never true    Ran Out of Food in the Last Year: Never true   Transportation Needs: No Transportation Needs (05/06/2024)   PRAPARE - Administrator, Civil Service (Medical): No    Lack of Transportation (Non-Medical): No  Physical Activity: Sufficiently Active (05/06/2024)   Exercise Vital Sign    Days of Exercise per Week: 3 days    Minutes of Exercise per Session: 50 min  Stress: No Stress Concern Present (05/06/2024)   [employer] of Occupational Health - Occupational Stress Questionnaire    Feeling of Stress : Not at all  Social Connections: Socially Integrated (05/06/2024)   Social Connection and Isolation Panel [NHANES]    Frequency of Communication with Friends and Family: More than three times a week    Frequency of Social Gatherings with Friends and Family: More than three times a week    Attends Religious Services: More than 4 times per year    Active Member of [employer] or Organizations: Yes    Attends Engineer, structural: More than 4 times per year    Marital Status: Married    Tobacco Counseling Counseling given: Not Answered   Clinical Intake:  Pre-visit preparation completed: Yes  Pain : No/denies pain     BMI - recorded: 28.25 Nutritional Status: BMI 25 -29 Overweight Nutritional Risks: None Diabetes: No  How often do you need to have someone help you when you read instructions, pamphlets, or other written materials from your doctor or pharmacy?: 1 - Never  Interpreter Needed?: No  Information entered by :: Francie Irani   Activities of Daily Living    05/06/2024    1:03 PM  In your present state of health, do you have any difficulty performing the following activities:  Hearing? 0  Vision? 0  Difficulty concentrating or making decisions? 0  Walking or climbing stairs? 0  Dressing or bathing? 0  Doing errands, shopping? 0  Preparing Food and eating ? Y  Using the Toilet? Y  In the past six months, have you accidently leaked urine? N  Do you have problems with loss of bowel control?  N  Managing your Medications? Y  Managing your Finances? Y  Housekeeping or managing your Housekeeping? Y    Patient Care Team: Arzu Mcgaughey C, NP as PCP - General (Family Medicine) Arnie Lao, MD as Consulting Physician (Orthopedic Surgery) Thais Fill, MD as Consulting Physician (Dermatology)  Indicate any recent Medical Services you may have received from other than [facility] providers in the past year (date may be approximate).     Assessment:    This is a routine wellness examination for [facility].  Hearing/Vision screen Hearing Screening - Comments:: Denies hearing difficulties     Goals Addressed   None    Depression Screen    05/06/2024   12:59 PM 01/26/2024    3:28 PM 01/19/2024   11:36 AM 04/03/2023   10:22 AM 07/15/2021    3:40 PM 10/01/2020  3:40 PM 06/22/2020    1:38 PM  PHQ 2/9 Scores  PHQ - 2 Score 0 0 0 0 0 0 0    Fall Risk    05/06/2024    1:02 PM 01/26/2024    3:28 PM 01/19/2024   11:35 AM 04/03/2023   10:22 AM 09/05/2022    1:46 PM  Fall Risk   Falls in the past year? 0 0 0 0 0  Number falls in past yr: 0 0 0 0 0  Injury with Fall? 0 0 0 0 0  Risk for fall due to : No Fall Risks No Fall Risks No Fall Risks No Fall Risks No Fall Risks  Follow up Falls evaluation completed Falls evaluation completed;Education provided;Falls prevention discussed Falls evaluation completed  Falls evaluation completed    MEDICARE RISK AT HOME:    TIMED UP AND GO:  Was the test performed?  No    Cognitive Function:        05/06/2024   12:59 PM 04/03/2023   10:23 AM  6CIT Screen  What Year? 0 points 0 points  What month? 0 points 0 points  What time? 0 points 0 points  Count back from 20 0 points 0 points  Months in reverse 0 points 0 points  Repeat phrase 0 points 0 points  Total Score 0 points 0 points    Immunizations Immunization History  Administered Date(s) Administered   PFIZER(Purple Top)SARS-COV-2 Vaccination 03/22/2020, 04/01/2020,  11/04/2020, 08/27/2021   PNEUMOCOCCAL CONJUGATE-20 07/29/2022   Tdap 08/20/2009   Zoster Recombinant(Shingrix) 12/16/2021, 03/18/2022    TDAP status: Due, Education has been provided regarding the importance of this vaccine. Advised may receive this vaccine at local pharmacy or Health Dept. Aware to provide a copy of the vaccination record if obtained from local pharmacy or Health Dept. Verbalized acceptance and understanding.  Flu Vaccine status: Up to date  Pneumococcal vaccine status: Up to date  Covid-19 vaccine status: Information provided on how to obtain vaccines.   Qualifies for Shingles Vaccine? Yes   Zostavax completed No   Shingrix Completed?: Yes  Screening Tests Health Maintenance  Topic Date Due   Medicare Annual Wellness (AWV)  04/02/2024   COVID-19 Vaccine (5 - 2024-25 season) 05/22/2024 (Originally 08/06/2023)   DTaP/Tdap/Td (2 - Td or Tdap) 05/06/2025 (Originally 08/21/2019)   INFLUENZA VACCINE  07/05/2024   Colonoscopy  04/06/2031   Pneumonia Vaccine 61+ Years old  Completed   Hepatitis C Screening  Completed   Zoster Vaccines- Shingrix  Completed   HPV VACCINES  Aged Out   Meningococcal B Vaccine  Aged Out    Health Maintenance  Health Maintenance Due  Topic Date Due   Medicare Annual Wellness (AWV)  04/02/2024    Colorectal cancer screening: Type of screening: Colonoscopy. Completed 04/05/2024. Repeat every 7 years  Lung Cancer Screening: (Low Dose CT Chest recommended if Age 48-80 years, 20 pack-year currently smoking OR have quit w/in 15years.) does not qualify.   Lung Cancer Screening Referral: N/A   Additional Screening:  Hepatitis C Screening: does qualify; Completed yes   Vision Screening: Recommended annual ophthalmology exams for early detection of glaucoma and other disorders of the eye. Is the patient up to date with their annual eye exam?  No  Who is the provider or what is the name of the office in which the patient attends annual eye  exams? No current eye doctor  If pt is not established with a provider, would they like to be  referred to a provider to establish care? Yes .   Dental Screening: Recommended annual dental exams for proper oral hygiene  Diabetic Foot Exam: Diabetic Foot Exam: Completed N/A   Community Resource Referral / Chronic Care Management: CRR required this visit?  No   CCM required this visit?  No     Plan:     I have personally reviewed and noted the following in the patient's chart:   Medical and social history Use of alcohol, tobacco or illicit drugs  Current medications and supplements including opioid prescriptions. Patient is not currently taking opioid prescriptions. Functional ability and status Nutritional status Physical activity Advanced directives List of other physicians Hospitalizations, surgeries, and ER visits in previous 12 months Vitals Screenings to include cognitive, depression, and falls Referrals and appointments  In addition, I have reviewed and discussed with patient certain preventive protocols, quality metrics, and best practice recommendations. A written personalized care plan for preventive services as well as general preventive health recommendations were provided to patient.     Estil Heman, NP   05/06/2024   After Visit Summary: (MyChart) Due to this being a telephonic visit, the after visit summary with patients personalized plan was offered to patient via MyChart   Nurse Notes: Advised to get COVID-19 and Tdap vaccine at the Pharmacy.   I connected with  Arlee Lace on 05/06/24 by a video enabled telemedicine application and verified that I am speaking with the correct person using two identifiers.   I discussed the limitations of evaluation and management by telemedicine. The patient expressed understanding and agreed to proceed.   Spent 18  minutes of face to face on video with patient  >50% time spent counseling; reviewing medical record;  tests; labs; and developing future plan of care.

## 2024-05-16 DIAGNOSIS — L918 Other hypertrophic disorders of the skin: Secondary | ICD-10-CM | POA: Diagnosis not present

## 2024-05-16 DIAGNOSIS — B079 Viral wart, unspecified: Secondary | ICD-10-CM | POA: Diagnosis not present

## 2024-05-16 DIAGNOSIS — D485 Neoplasm of uncertain behavior of skin: Secondary | ICD-10-CM | POA: Diagnosis not present

## 2024-05-17 ENCOUNTER — Other Ambulatory Visit: Payer: PPO

## 2024-05-17 DIAGNOSIS — E782 Mixed hyperlipidemia: Secondary | ICD-10-CM | POA: Diagnosis not present

## 2024-05-17 DIAGNOSIS — R7303 Prediabetes: Secondary | ICD-10-CM | POA: Diagnosis not present

## 2024-05-18 LAB — COMPLETE METABOLIC PANEL WITHOUT GFR
AG Ratio: 1.7 (calc) (ref 1.0–2.5)
ALT: 18 U/L (ref 9–46)
AST: 17 U/L (ref 10–35)
Albumin: 3.9 g/dL (ref 3.6–5.1)
Alkaline phosphatase (APISO): 57 U/L (ref 35–144)
BUN: 20 mg/dL (ref 7–25)
CO2: 28 mmol/L (ref 20–32)
Calcium: 8.7 mg/dL (ref 8.6–10.3)
Chloride: 106 mmol/L (ref 98–110)
Creat: 1.23 mg/dL (ref 0.70–1.35)
Globulin: 2.3 g/dL (ref 1.9–3.7)
Glucose, Bld: 103 mg/dL — ABNORMAL HIGH (ref 65–99)
Potassium: 4.8 mmol/L (ref 3.5–5.3)
Sodium: 141 mmol/L (ref 135–146)
Total Bilirubin: 0.8 mg/dL (ref 0.2–1.2)
Total Protein: 6.2 g/dL (ref 6.1–8.1)

## 2024-05-18 LAB — CBC WITH DIFFERENTIAL/PLATELET
Absolute Lymphocytes: 1680 {cells}/uL (ref 850–3900)
Absolute Monocytes: 554 {cells}/uL (ref 200–950)
Basophils Absolute: 78 {cells}/uL (ref 0–200)
Basophils Relative: 1.4 %
Eosinophils Absolute: 62 {cells}/uL (ref 15–500)
Eosinophils Relative: 1.1 %
HCT: 48.3 % (ref 38.5–50.0)
Hemoglobin: 15.8 g/dL (ref 13.2–17.1)
MCH: 30.6 pg (ref 27.0–33.0)
MCHC: 32.7 g/dL (ref 32.0–36.0)
MCV: 93.6 fL (ref 80.0–100.0)
MPV: 9.3 fL (ref 7.5–12.5)
Monocytes Relative: 9.9 %
Neutro Abs: 3226 {cells}/uL (ref 1500–7800)
Neutrophils Relative %: 57.6 %
Platelets: 253 10*3/uL (ref 140–400)
RBC: 5.16 10*6/uL (ref 4.20–5.80)
RDW: 11.8 % (ref 11.0–15.0)
Total Lymphocyte: 30 %
WBC: 5.6 10*3/uL (ref 3.8–10.8)

## 2024-05-18 LAB — HEMOGLOBIN A1C
Hgb A1c MFr Bld: 6 % — ABNORMAL HIGH (ref <5.7)
Mean Plasma Glucose: 126 mg/dL
eAG (mmol/L): 7 mmol/L

## 2024-05-18 LAB — LIPID PANEL
Cholesterol: 149 mg/dL (ref <200)
HDL: 34 mg/dL — ABNORMAL LOW (ref >=40)
LDL Cholesterol (Calc): 97 mg/dL
Non-HDL Cholesterol (Calc): 115 mg/dL (ref <130)
Total CHOL/HDL Ratio: 4.4 (calc) (ref <5.0)
Triglycerides: 89 mg/dL (ref <150)

## 2024-05-20 ENCOUNTER — Encounter: Payer: Self-pay | Admitting: Family

## 2024-05-20 ENCOUNTER — Ambulatory Visit (INDEPENDENT_AMBULATORY_CARE_PROVIDER_SITE_OTHER): Payer: PPO | Admitting: Family

## 2024-05-20 VITALS — BP 108/68 | HR 60 | Temp 97.7°F | Resp 20 | Ht 74.0 in | Wt 229.6 lb

## 2024-05-20 DIAGNOSIS — R7303 Prediabetes: Secondary | ICD-10-CM

## 2024-05-20 DIAGNOSIS — E782 Mixed hyperlipidemia: Secondary | ICD-10-CM

## 2024-05-20 NOTE — Progress Notes (Signed)
 Provider: Christean Courts FNP-C   Lemond Griffee, Elijio Guadeloupe, NP  Patient Care Team: Jacarie Pate, Elijio Guadeloupe, NP as PCP - General (Family Medicine) Arnie Lao, MD as Consulting Physician (Orthopedic Surgery) Thais Fill, MD as Consulting Physician (Dermatology)  Extended Emergency Contact Information Primary Emergency Contact: Burkholder,Susan Address: [address]          [place], [place] [address] [place] Home Phone: [phone] Mobile Phone: [phone] Relation: Spouse  Code Status:  Full Code  Goals of care: Advanced Directive information    05/06/2024    1:01 PM  Advanced Directives  Does Patient Have a Medical Advance Directive? Yes  Type of Estate agent of [place];Living will  Copy of Healthcare Power of Attorney in Chart? No - copy requested     Chief Complaint  Patient presents with   Medical Management of Chronic Issues    4 month follow up.    Discussed the use of AI scribe software for clinical note transcription with the patient, who gave verbal consent to proceed.  History of Present Illness   Juan Powers is a 68 year old male who presents for a four-month follow-up visit.  He underwent a colonoscopy last month, during which a couple of polyps were removed. The results were benign, and he is not required to have another colonoscopy for seven years.  He has fully retired and is no longer working part-time, allowing him to engage in more regular exercise. He goes to the [facility] three times a week, utilizing the weight room and riding his bicycle for 45 to 50 minutes, including uphill rides. He maintains a regular exercise schedule and has noticed a reduction in abdominal fat, which he attributes to increased physical activity and dietary changes.  He has reduced his intake of sugary foods and simple carbohydrates. He eats out once a week with friends, choosing healthier options like salads or fish over burgers and fries. His  weight is similar to the last visit, but he believes he has gained muscle and lost fat due to his exercise routine.  His A1c has decreased from 6.6 to 6.0. He is currently taking Zyrtec and atorvastatin  daily, and uses Advil, Celebrex , and methocarbamol  as needed. He has stopped taking a multivitamin, feeling that he gets sufficient nutrition from his diet.  He drinks about one alcoholic drink per week and uses sunscreen regularly. No issues with his knees or other joints, and no problems with urination or bowel movements. He has noticed an improvement in his vision over the past year, requiring lower strength reading glasses and no longer needing distance glasses.   Past Medical History:  Diagnosis Date   Anemia    LATE TEENS EARLY 20S    Arthritis    RIGHT HIP    Basal cell carcinoma    MULTIPLE OVER THE YEARS   Hepatitis    HEP A IN THE EARLY 80s , TREATED AND RESOLVED    Hyperlipidemia    Tinnitus    Past Surgical History:  Procedure Laterality Date   COLONOSCOPY     TONSILLECTOMY     CHILDHOOD    TOTAL HIP ARTHROPLASTY Right 11/23/2018   Procedure: RIGHT TOTAL HIP ARTHROPLASTY ANTERIOR APPROACH;  Surgeon: Arnie Lao, MD;  Location: [facility];  Service: Orthopedics;  Laterality: Right;   TOTAL HIP ARTHROPLASTY Left 10/18/2019   Procedure: LEFT TOTAL HIP ARTHROPLASTY ANTERIOR APPROACH;  Surgeon: Arnie Lao, MD;  Location: [facility];  Service: Orthopedics;  Laterality: Left;    Allergies  Allergen Reactions   Other Shortness Of Breath and Swelling    Pan alba,   Swelling in throat and tounge    Allergies as of 05/20/2024       Reactions   Other Shortness Of Breath, Swelling   Pan alba,   Swelling in throat and tounge        Medication List        Accurate as of May 20, 2024  9:25 AM. If you have any questions, ask your nurse or doctor.          STOP taking these medications    MULTIVITAMIN ADULT PO Stopped by: Samaria Anes C Shakera Ebrahimi        TAKE these medications    atorvastatin  10 MG tablet Commonly known as: LIPITOR TAKE 1 TABLET(10 MG) BY MOUTH DAILY   celecoxib  200 MG capsule Commonly known as: CELEBREX  Take 1 capsule (200 mg total) by mouth 2 (two) times daily between meals as needed.   cetirizine 10 MG tablet Commonly known as: ZYRTEC Take 10 mg by mouth as needed for allergies.   ibuprofen 200 MG tablet Commonly known as: ADVIL Take 200 mg by mouth as needed for moderate pain (pain score 4-6) or mild pain (pain score 1-3).   methocarbamol  500 MG tablet Commonly known as: ROBAXIN  Take 1 tablet (500 mg total) by mouth every 6 (six) hours as needed.        Review of Systems  Constitutional:  Negative for appetite change, chills, fatigue, fever and unexpected weight change.  HENT:  Negative for congestion, dental problem, ear discharge, ear pain, facial swelling, hearing loss, nosebleeds, postnasal drip, rhinorrhea, sinus pressure, sinus pain, sneezing, sore throat, tinnitus and trouble swallowing.   Eyes:  Negative for pain, discharge, redness, itching and visual disturbance.  Respiratory:  Negative for cough, chest tightness, shortness of breath and wheezing.   Cardiovascular:  Negative for chest pain, palpitations and leg swelling.  Gastrointestinal:  Negative for abdominal distention, abdominal pain, blood in stool, constipation, diarrhea, nausea and vomiting.  Endocrine: Negative for cold intolerance, heat intolerance, polydipsia, polyphagia and polyuria.  Genitourinary:  Negative for difficulty urinating, dysuria, flank pain, frequency and urgency.  Musculoskeletal:  Negative for arthralgias, back pain, gait problem, joint swelling, myalgias, neck pain and neck stiffness.  Skin:  Negative for color change, pallor, rash and wound.  Neurological:  Negative for dizziness, syncope, speech difficulty, weakness, light-headedness, numbness and headaches.  Hematological:  Does not bruise/bleed easily.   Psychiatric/Behavioral:  Negative for agitation, behavioral problems, confusion, hallucinations, self-injury, sleep disturbance and suicidal ideas. The patient is not nervous/anxious.     Immunization History  Administered Date(s) Administered   PFIZER(Purple Top)SARS-COV-2 Vaccination 03/22/2020, 04/01/2020, 11/04/2020, 08/27/2021   PNEUMOCOCCAL CONJUGATE-20 07/29/2022   Tdap 08/20/2009   Zoster Recombinant(Shingrix) 12/16/2021, 03/18/2022   Pertinent  Health Maintenance Due  Topic Date Due   INFLUENZA VACCINE  07/05/2024   Colonoscopy  04/06/2031      09/05/2022    1:46 PM 04/03/2023   10:22 AM 01/19/2024   11:35 AM 01/26/2024    3:28 PM 05/06/2024    1:02 PM  Fall Risk  Falls in the past year? 0 0 0 0 0  Was there an injury with Fall? 0 0 0 0 0  Fall Risk Category Calculator 0 0 0 0 0  Fall Risk Category (Retired) Low       (RETIRED) Patient Fall Risk Level Low fall risk  Patient at Risk for Falls Due to No Fall Risks No Fall Risks No Fall Risks No Fall Risks No Fall Risks  Fall risk Follow up Falls evaluation completed   Falls evaluation completed Falls evaluation completed;Education provided;Falls prevention discussed Falls evaluation completed     Data saved with a previous flowsheet row definition   Functional Status Survey:    Vitals:   05/20/24 0830  BP: 108/68  Pulse: 60  Resp: 20  Temp: 97.7 F (36.5 C)  SpO2: 92%  Weight: 229 lb 9.6 oz (104.1 kg)  Height: 6' 2 (1.88 m)   Body mass index is 29.48 kg/m. Physical Exam  VITALS: P- 60, BP- 108/68, SaO2- 92% MEASUREMENTS: Weight- 229. GENERAL: Alert, cooperative, well developed, no acute distress HEENT: Normocephalic, normal oropharynx, moist mucous membranes, ears and nose normal, no sinus tenderness CHEST: Clear to auscultation bilaterally, no wheezes, rhonchi, or crackles CARDIOVASCULAR: Normal heart rate and rhythm, S1 and S2 normal without murmurs ABDOMEN: Soft, non-tender, non-distended, without  organomegaly, normal bowel sounds EXTREMITIES: No cyanosis or edema MUSCULOSKELETAL: Hips and knees with normal range of motion NEUROLOGICAL: Cranial nerves grossly intact, moves all extremities without gross motor or sensory deficit  SKIN: No rash,no lesion or erythema   PSYCHIATRY/BEHAVIORAL: Mood stable   Labs reviewed: Recent Labs    01/17/24 0848 05/17/24 0802  NA 138 141  K 4.5 4.8  CL 106 106  CO2 24 28  GLUCOSE 123* 103*  BUN 18 20  CREATININE 1.01 1.23  CALCIUM  9.1 8.7   Recent Labs    01/17/24 0848 05/17/24 0802  AST 17 17  ALT 18 18  BILITOT 0.5 0.8  PROT 6.5 6.2   Recent Labs    01/17/24 0848 05/17/24 0802  WBC 6.5 5.6  NEUTROABS 3,770 3,226  HGB 15.6 15.8  HCT 47.0 48.3  MCV 91.1 93.6  PLT 398 253   Lab Results  Component Value Date   TSH 1.64 01/17/2024   Lab Results  Component Value Date   HGBA1C 6.0 (H) 05/17/2024   Lab Results  Component Value Date   CHOL 149 05/17/2024   HDL 34 (L) 05/17/2024   LDLCALC 97 05/17/2024   TRIG 89 05/17/2024   CHOLHDL 4.4 05/17/2024    Significant Diagnostic Results in last 30 days:  No results found.  Assessment/Plan  Prediabetes A1c decreased from 6.6% to 6.0% due to increased exercise and dietary changes, moving from the diabetic range to the prediabetic range. Glucose levels improved from 123 mg/dL to 096 mg/dL, approaching the target of below 100 mg/dL. He is enjoying the exercise routine and dietary changes, which is beneficial for long-term adherence. The goal is to reduce A1c to below 5.7% to exit the prediabetic range. - Continue regular exercise, including cardio and weight training. - Maintain dietary changes focusing on reducing simple carbohydrates and sugars. - Recheck A1c and glucose in six months.  Hyperlipidemia Lipid profile shows improvement with total cholesterol at 149 mg/dL, HDL increasing from 32 to 34 mg/dL, LDL at 97 mg/dL, and triglycerides decreasing from 101 to 89 mg/dL. He  is on atorvastatin  and has made lifestyle changes, including increased exercise and dietary modifications, contributing to these improvements. The goal is to increase HDL above 40 mg/dL to strengthen cardiovascular health. - Continue atorvastatin  daily. - Maintain regular exercise and dietary modifications. - Recheck lipid profile in six months.  General Health Maintenance Engaging in regular physical activity and has made positive dietary changes. Monitoring vision and plans to see  an eye doctor. Aware of the importance of hydration and is using a low-sugar electrolyte supplement. Using sunscreen and visiting a dermatologist regularly. No current need for multivitamin supplementation as nutritional needs are met through diet. - Encourage continued regular exercise and healthy diet. - Schedule an eye examination. - Continue using sunscreen and attending regular dermatology appointments. - Maintain hydration with low-sugar electrolyte supplements.   Family/ staff Communication: Reviewed plan of care with patient verbalized understanding   Labs/tests ordered:  - CBC with Differential/Platelet - CMP with eGFR([facility]) - TSH - Hgb A1C - Lipid panel  Next Appointment : Return in about 6 months (around 11/19/2024) for medical mangement of chronic issues., Fasting labs in 6 months prior to visit.   Spent 30 minutes of Face to face and non-face to face with patient  >50% time spent counseling; reviewing medical record; tests; labs; documentation and developing future plan of care.   Estil Heman, NP

## 2024-05-29 ENCOUNTER — Other Ambulatory Visit (HOSPITAL_BASED_OUTPATIENT_CLINIC_OR_DEPARTMENT_OTHER): Payer: Self-pay

## 2024-05-29 ENCOUNTER — Other Ambulatory Visit: Payer: Self-pay | Admitting: Family

## 2024-05-29 DIAGNOSIS — E782 Mixed hyperlipidemia: Secondary | ICD-10-CM

## 2024-05-29 MED ORDER — ATORVASTATIN CALCIUM 10 MG PO TABS
10.0000 mg | ORAL_TABLET | Freq: Every day | ORAL | 1 refills | Status: DC
  Filled 2024-05-29 – 2024-06-10 (×2): qty 90, 90d supply, fill #0

## 2024-05-29 NOTE — Telephone Encounter (Signed)
 Copied from CRM [id number]. Topic: Clinical - Medication Refill >> May 29, 2024  2:36 PM Miquel SAILOR wrote: Medication: atorvastatin  (LIPITOR) 10 MG tablet   Has the patient contacted their pharmacy? Yes (Agent: If no, request that the patient contact the pharmacy for the refill. If patient does not wish to contact the pharmacy document the reason why and proceed with request.) (Agent: If yes, when and what did the pharmacy advise?)  This is the patient's preferred pharmacy:  [facility] [address] [place] [address] Phone: [phone] Fax: [phone]  Is this the correct pharmacy for this prescription? Yes If no, delete pharmacy and type the correct one.   Has the prescription been filled recently? Yes  Is the patient out of the medication? No 1 week left  Has the patient been seen for an appointment in the last year OR does the patient have an upcoming appointment? Yes  Can we respond through MyChart? Yes  Agent: Please be advised that Rx refills may take up to 3 business days. We ask that you follow-up with your pharmacy.

## 2024-06-10 ENCOUNTER — Other Ambulatory Visit: Payer: Self-pay

## 2024-06-10 ENCOUNTER — Other Ambulatory Visit (HOSPITAL_BASED_OUTPATIENT_CLINIC_OR_DEPARTMENT_OTHER): Payer: Self-pay

## 2024-07-24 ENCOUNTER — Other Ambulatory Visit: Payer: Self-pay

## 2024-08-19 DIAGNOSIS — D225 Melanocytic nevi of trunk: Secondary | ICD-10-CM | POA: Diagnosis not present

## 2024-08-19 DIAGNOSIS — L570 Actinic keratosis: Secondary | ICD-10-CM | POA: Diagnosis not present

## 2024-08-19 DIAGNOSIS — L821 Other seborrheic keratosis: Secondary | ICD-10-CM | POA: Diagnosis not present

## 2024-08-19 DIAGNOSIS — D2239 Melanocytic nevi of other parts of face: Secondary | ICD-10-CM | POA: Diagnosis not present

## 2024-08-19 DIAGNOSIS — Z85828 Personal history of other malignant neoplasm of skin: Secondary | ICD-10-CM | POA: Diagnosis not present

## 2024-08-19 DIAGNOSIS — L578 Other skin changes due to chronic exposure to nonionizing radiation: Secondary | ICD-10-CM | POA: Diagnosis not present

## 2024-08-26 ENCOUNTER — Other Ambulatory Visit (HOSPITAL_BASED_OUTPATIENT_CLINIC_OR_DEPARTMENT_OTHER): Payer: Self-pay

## 2024-08-26 ENCOUNTER — Encounter: Payer: Self-pay | Admitting: Pharmacist

## 2024-08-26 ENCOUNTER — Other Ambulatory Visit: Payer: Self-pay | Admitting: Family

## 2024-08-26 DIAGNOSIS — E782 Mixed hyperlipidemia: Secondary | ICD-10-CM

## 2024-08-26 MED ORDER — ATORVASTATIN CALCIUM 10 MG PO TABS
10.0000 mg | ORAL_TABLET | Freq: Every day | ORAL | 1 refills | Status: DC
  Filled 2024-08-26 – 2024-08-30 (×2): qty 90, 90d supply, fill #0

## 2024-08-26 NOTE — Telephone Encounter (Signed)
 Copied from CRM [id number]. Topic: Clinical - Medication Refill >> Aug 26, 2024 11:00 AM Suzette B wrote: Medication: atorvastatin  (LIPITOR) 10 MG tablet  Has the patient contacted their pharmacy?Yes   [facility] [address] [place] [address] Phone: [phone] Fax: [phone]  Is this the correct pharmacy for this prescription? Yes If no, delete pharmacy and type the correct one.   Has the prescription been filled recently? Yes 06/25  Is the patient out of the medication? No  Has the patient been seen for an appointment in the last year OR does the patient have an upcoming appointment? Yes  Can we respond through MyChart? Yes  Agent: Please be advised that Rx refills may take up to 3 business days. We ask that you follow-up with your pharmacy.

## 2024-08-26 NOTE — Progress Notes (Signed)
   08/26/2024  Patient ID: Juan Powers, male   DOB: Oct 17, 1956, 68 y.o.   MRN: [id number]  Pharmacy Quality Measure Review  This patient is appearing on a report for being at risk of failing the adherence measure for cholesterol (statin) medications this calendar year.   Medication: Atorvastatin  10 mg Last fill date: 06/10/24 for 90 day supply  Filled 06/10/24 for a 90 day supply. Last impact date is 09/10/24--will try to get this to go through 2 days early.   Juan Powers, PharmD, BCACP Clinical Pharmacist [phone]

## 2024-08-30 ENCOUNTER — Other Ambulatory Visit: Payer: Self-pay

## 2024-08-30 ENCOUNTER — Other Ambulatory Visit (HOSPITAL_BASED_OUTPATIENT_CLINIC_OR_DEPARTMENT_OTHER): Payer: Self-pay

## 2024-09-03 ENCOUNTER — Other Ambulatory Visit: Payer: Self-pay

## 2024-09-09 ENCOUNTER — Encounter: Payer: Self-pay | Admitting: Pharmacist

## 2024-09-09 NOTE — Progress Notes (Signed)
   09/09/2024  Patient ID: Juan Powers Daring, male   DOB: Aug 28, 1956, 68 y.o.   MRN: [id number]  Pharmacy Quality Measure Review  This patient is appearing on a report for being at risk of failing the adherence measure for cholesterol (statin) medications this calendar year.   Medication: Atorvastatin  10 mg Last fill date: 09/03/34 for 90 day supply  Chart review for follow up from previous report entry.  Patient should pass measure. As Atorvatatin was filled for a 90 day supply prior to the last impact date  Cassius DOROTHA Brought, PharmD, [facility] Clinical Pharmacist [phone]

## 2024-09-30 ENCOUNTER — Other Ambulatory Visit (HOSPITAL_BASED_OUTPATIENT_CLINIC_OR_DEPARTMENT_OTHER): Payer: Self-pay

## 2024-10-03 ENCOUNTER — Other Ambulatory Visit: Payer: Self-pay

## 2024-11-15 ENCOUNTER — Other Ambulatory Visit: Payer: Self-pay

## 2024-11-15 DIAGNOSIS — R7303 Prediabetes: Secondary | ICD-10-CM

## 2024-11-15 DIAGNOSIS — E782 Mixed hyperlipidemia: Secondary | ICD-10-CM

## 2024-11-16 LAB — HEMOGLOBIN A1C
Hgb A1c MFr Bld: 5.8 % — ABNORMAL HIGH (ref <5.7)
Mean Plasma Glucose: 120 mg/dL
eAG (mmol/L): 6.6 mmol/L

## 2024-11-16 LAB — LIPID PANEL
Cholesterol: 120 mg/dL (ref <200)
HDL: 37 mg/dL — ABNORMAL LOW (ref >=40)
LDL Cholesterol (Calc): 67 mg/dL
Non-HDL Cholesterol (Calc): 83 mg/dL (ref <130)
Total CHOL/HDL Ratio: 3.2 (calc) (ref <5.0)
Triglycerides: 80 mg/dL (ref <150)

## 2024-11-16 LAB — CBC WITH DIFFERENTIAL/PLATELET
Absolute Lymphocytes: 1807 {cells}/uL (ref 850–3900)
Absolute Monocytes: 663 {cells}/uL (ref 200–950)
Basophils Absolute: 91 {cells}/uL (ref 0–200)
Basophils Relative: 1.4 %
Eosinophils Absolute: 91 {cells}/uL (ref 15–500)
Eosinophils Relative: 1.4 %
HCT: 47.6 % (ref 39.4–51.1)
Hemoglobin: 15.5 g/dL (ref 13.2–17.1)
MCH: 30.5 pg (ref 27.0–33.0)
MCHC: 32.6 g/dL (ref 31.6–35.4)
MCV: 93.5 fL (ref 81.4–101.7)
MPV: 9.8 fL (ref 7.5–12.5)
Monocytes Relative: 10.2 %
Neutro Abs: 3848 {cells}/uL (ref 1500–7800)
Neutrophils Relative %: 59.2 %
Platelets: 285 Thousand/uL (ref 140–400)
RBC: 5.09 Million/uL (ref 4.20–5.80)
RDW: 11.6 % (ref 11.0–15.0)
Total Lymphocyte: 27.8 %
WBC: 6.5 Thousand/uL (ref 3.8–10.8)

## 2024-11-16 LAB — COMPLETE METABOLIC PANEL WITHOUT GFR
AG Ratio: 1.7 (calc) (ref 1.0–2.5)
ALT: 15 U/L (ref 9–46)
AST: 19 U/L (ref 10–35)
Albumin: 4 g/dL (ref 3.6–5.1)
Alkaline phosphatase (APISO): 66 U/L (ref 35–144)
BUN: 24 mg/dL (ref 7–25)
CO2: 28 mmol/L (ref 20–32)
Calcium: 9.1 mg/dL (ref 8.6–10.3)
Chloride: 106 mmol/L (ref 98–110)
Creat: 1.25 mg/dL (ref 0.70–1.35)
Globulin: 2.3 g/dL (ref 1.9–3.7)
Glucose, Bld: 99 mg/dL (ref 65–99)
Potassium: 4.9 mmol/L (ref 3.5–5.3)
Sodium: 140 mmol/L (ref 135–146)
Total Bilirubin: 0.5 mg/dL (ref 0.2–1.2)
Total Protein: 6.3 g/dL (ref 6.1–8.1)

## 2024-11-16 LAB — TSH: TSH: 1.76 m[IU]/L (ref 0.40–4.50)

## 2024-11-18 ENCOUNTER — Ambulatory Visit: Payer: Self-pay | Admitting: Family

## 2024-11-18 ENCOUNTER — Encounter: Payer: Self-pay | Admitting: Family

## 2024-11-18 ENCOUNTER — Other Ambulatory Visit (HOSPITAL_BASED_OUTPATIENT_CLINIC_OR_DEPARTMENT_OTHER): Payer: Self-pay

## 2024-11-18 VITALS — BP 130/80 | HR 60 | Temp 97.8°F | Resp 18 | Ht 74.0 in | Wt 233.8 lb

## 2024-11-18 DIAGNOSIS — G8929 Other chronic pain: Secondary | ICD-10-CM

## 2024-11-18 DIAGNOSIS — M545 Low back pain, unspecified: Secondary | ICD-10-CM

## 2024-11-18 DIAGNOSIS — M25551 Pain in right hip: Secondary | ICD-10-CM | POA: Diagnosis not present

## 2024-11-18 DIAGNOSIS — E782 Mixed hyperlipidemia: Secondary | ICD-10-CM

## 2024-11-18 DIAGNOSIS — R7303 Prediabetes: Secondary | ICD-10-CM | POA: Diagnosis not present

## 2024-11-18 DIAGNOSIS — M25552 Pain in left hip: Secondary | ICD-10-CM

## 2024-11-18 MED ORDER — ATORVASTATIN CALCIUM 10 MG PO TABS
10.0000 mg | ORAL_TABLET | Freq: Every day | ORAL | 1 refills | Status: AC
  Filled 2024-11-18 – 2024-12-13 (×2): qty 90, 90d supply, fill #0

## 2024-11-18 NOTE — Progress Notes (Signed)
 "  Provider: Zanaya Baize FNP-C   Cindie Rajagopalan, Roxan BROCKS, NP  Patient Care Team: Stephanieann Popescu, Roxan BROCKS, NP as PCP - General (Family Medicine) Vernetta Lonni GRADE, MD as Consulting Physician (Orthopedic Surgery) Robinson Pao, MD as Consulting Physician (Dermatology)  Extended Emergency Contact Information Primary Emergency Contact: Burkholder,Susan Address: [address]          [place], [place] [address] [place] Home Phone: [phone] Mobile Phone: [phone] Relation: Spouse  Code Status:  Full Code  Goals of care: Advanced Directive information    11/18/2024    9:29 AM  Advanced Directives  Does Patient Have a Medical Advance Directive? Yes  Type of Advance Directive Healthcare Power of Attorney  Does patient want to make changes to medical advance directive? No - Patient declined  Copy of Healthcare Power of Attorney in Chart? No - copy requested     Chief Complaint  Patient presents with   Medical Management of Chronic Issues    6 Month follow up.    HPI:  Pt is a 68 y.o. male seen today for 6 months follow up medical management of chronic diseases. Has a medical history mixed hyperlipidemia, prediabetes, bilateral hip replacement pain and chronic low back pain. He denies any acute issues at this visit.  He requests refills on atorvastatin .He denies any  Muscle pain or muscle aches. He continues to require Celebrex  and Robaxin  as needed.  For bilateral hips and low back pain but rarely takes it.Takes ibuprofen as needed for pain mostly. Has had no fall episodes from imbalance gait but had a gently fall from his bike. Has been exercising by going to the gym and biking though has not been consistent since he has been singing christmas carols with different groups during the holiday seasons.   Past Medical History:  Diagnosis Date   Anemia    LATE TEENS EARLY 20S    Arthritis    RIGHT HIP    Basal cell carcinoma    MULTIPLE OVER THE YEARS   Hepatitis     HEP A IN THE EARLY 80s , TREATED AND RESOLVED    Hyperlipidemia    Tinnitus    Past Surgical History:  Procedure Laterality Date   COLONOSCOPY     TONSILLECTOMY     CHILDHOOD    TOTAL HIP ARTHROPLASTY Right 11/23/2018   Procedure: RIGHT TOTAL HIP ARTHROPLASTY ANTERIOR APPROACH;  Surgeon: Vernetta Lonni GRADE, MD;  Location: [facility];  Service: Orthopedics;  Laterality: Right;   TOTAL HIP ARTHROPLASTY Left 10/18/2019   Procedure: LEFT TOTAL HIP ARTHROPLASTY ANTERIOR APPROACH;  Surgeon: Vernetta Lonni GRADE, MD;  Location: [facility];  Service: Orthopedics;  Laterality: Left;    Allergies[1]  Allergies as of 11/18/2024       Reactions   Other Shortness Of Breath, Swelling   Pan alba,   Swelling in throat and tounge        Medication List        Accurate as of November 18, 2024 10:01 AM. If you have any questions, ask your nurse or doctor.          atorvastatin  10 MG tablet Commonly known as: LIPITOR Take 1 tablet (10 mg total) by mouth daily.   celecoxib  200 MG capsule Commonly known as: CELEBREX  Take 1 capsule (200 mg total) by mouth 2 (two) times daily between meals as needed.   cetirizine 10 MG tablet Commonly known as: ZYRTEC Take 10 mg by mouth as needed for allergies. What  changed: when to take this   ibuprofen 200 MG tablet Commonly known as: ADVIL Take 200 mg by mouth as needed for moderate pain (pain score 4-6) or mild pain (pain score 1-3).   methocarbamol  500 MG tablet Commonly known as: ROBAXIN  Take 1 tablet (500 mg total) by mouth every 6 (six) hours as needed.        Review of Systems  Constitutional:  Negative for appetite change, chills, fatigue, fever and unexpected weight change.  HENT:  Negative for congestion, dental problem, ear discharge, ear pain, facial swelling, hearing loss, nosebleeds, postnasal drip, rhinorrhea, sinus pressure, sinus pain, sneezing, sore throat, tinnitus and trouble swallowing.   Eyes:  Negative for pain,  discharge, redness, itching and visual disturbance.  Respiratory:  Negative for cough, chest tightness, shortness of breath and wheezing.   Cardiovascular:  Negative for chest pain, palpitations and leg swelling.  Gastrointestinal:  Negative for abdominal distention, abdominal pain, blood in stool, constipation, diarrhea, nausea and vomiting.  Endocrine: Negative for cold intolerance, heat intolerance, polydipsia, polyphagia and polyuria.  Genitourinary:  Negative for difficulty urinating, dysuria, flank pain, frequency and urgency.  Musculoskeletal:  Positive for arthralgias and back pain. Negative for gait problem, joint swelling, myalgias, neck pain and neck stiffness.  Skin:  Negative for color change, pallor, rash and wound.  Neurological:  Negative for dizziness, syncope, speech difficulty, weakness, light-headedness, numbness and headaches.  Hematological:  Does not bruise/bleed easily.  Psychiatric/Behavioral:  Negative for agitation, behavioral problems, confusion, hallucinations, self-injury, sleep disturbance and suicidal ideas. The patient is not nervous/anxious.     Immunization History  Administered Date(s) Administered   PFIZER(Purple Top)SARS-COV-2 Vaccination 03/22/2020, 04/01/2020, 11/04/2020, 08/27/2021   PNEUMOCOCCAL CONJUGATE-20 07/29/2022   Tdap 08/20/2009   Zoster Recombinant(Shingrix) 12/16/2021, 03/18/2022   Pertinent  Health Maintenance Due  Topic Date Due   Influenza Vaccine  03/04/2025 (Originally 07/05/2024)   Colonoscopy  04/06/2031      04/03/2023   10:22 AM 01/19/2024   11:35 AM 01/26/2024    3:28 PM 05/06/2024    1:02 PM 11/18/2024    9:25 AM  Fall Risk  Falls in the past year? 0 0 0 0 1  Was there an injury with Fall? 0  0  0  0  0  Fall Risk Category Calculator 0 0 0 0 1  Patient at Risk for Falls Due to No Fall Risks No Fall Risks No Fall Risks No Fall Risks No Fall Risks  Fall risk Follow up  Falls evaluation completed Falls evaluation  completed;Education provided;Falls prevention discussed Falls evaluation completed Falls evaluation completed     Data saved with a previous flowsheet row definition   Functional Status Survey:    Vitals:   11/18/24 0931  BP: 130/80  Pulse: 60  Resp: 18  Temp: 97.8 F (36.6 C)  SpO2: 98%  Weight: 233 lb 12.8 oz (106.1 kg)  Height: 6' 2 (1.88 m)   Body mass index is 30.02 kg/m. Physical Exam Vitals reviewed.  Constitutional:      General: He is not in acute distress.    Appearance: Normal appearance. He is normal weight. He is not ill-appearing or diaphoretic.  HENT:     Head: Normocephalic.     Right Ear: Tympanic membrane, ear canal and external ear normal. There is no impacted cerumen.     Left Ear: Tympanic membrane, ear canal and external ear normal. There is no impacted cerumen.     Nose: Nose normal. No congestion or rhinorrhea.  Mouth/Throat:     Mouth: Mucous membranes are moist.     Pharynx: Oropharynx is clear. No oropharyngeal exudate or posterior oropharyngeal erythema.  Eyes:     General: No scleral icterus.       Right eye: No discharge.        Left eye: No discharge.     Extraocular Movements: Extraocular movements intact.     Conjunctiva/sclera: Conjunctivae normal.     Pupils: Pupils are equal, round, and reactive to light.  Neck:     Vascular: No carotid bruit.  Cardiovascular:     Rate and Rhythm: Normal rate and regular rhythm.     Pulses: Normal pulses.     Heart sounds: Normal heart sounds. No murmur heard.    No friction rub. No gallop.  Pulmonary:     Effort: Pulmonary effort is normal. No respiratory distress.     Breath sounds: Normal breath sounds. No wheezing, rhonchi or rales.  Chest:     Chest wall: No tenderness.  Abdominal:     General: Bowel sounds are normal. There is no distension.     Palpations: Abdomen is soft. There is no mass.     Tenderness: There is no abdominal tenderness. There is no right CVA tenderness, left CVA  tenderness, guarding or rebound.  Musculoskeletal:        General: No swelling or tenderness. Normal range of motion.     Cervical back: Normal range of motion. No rigidity or tenderness.     Right lower leg: No edema.     Left lower leg: No edema.  Lymphadenopathy:     Cervical: No cervical adenopathy.  Skin:    General: Skin is warm and dry.     Coloration: Skin is not pale.     Findings: No bruising, erythema, lesion or rash.  Neurological:     Mental Status: He is alert and oriented to person, place, and time.     Cranial Nerves: No cranial nerve deficit.     Sensory: No sensory deficit.     Motor: No weakness.     Coordination: Coordination normal.     Gait: Gait normal.  Psychiatric:        Mood and Affect: Mood normal.        Speech: Speech normal.        Behavior: Behavior normal.        Thought Content: Thought content normal.        Judgment: Judgment normal.    Labs reviewed: Recent Labs    01/17/24 0848 05/17/24 0802 11/15/24 0806  NA 138 141 140  K 4.5 4.8 4.9  CL 106 106 106  CO2 24 28 28   GLUCOSE 123* 103* 99  BUN 18 20 24   CREATININE 1.01 1.23 1.25  CALCIUM  9.1 8.7 9.1   Recent Labs    01/17/24 0848 05/17/24 0802 11/15/24 0806  AST 17 17 19   ALT 18 18 15   BILITOT 0.5 0.8 0.5  PROT 6.5 6.2 6.3   Recent Labs    01/17/24 0848 05/17/24 0802 11/15/24 0806  WBC 6.5 5.6 6.5  NEUTROABS 3,770 3,226 3,848  HGB 15.6 15.8 15.5  HCT 47.0 48.3 47.6  MCV 91.1 93.6 93.5  PLT 398 253 285   Lab Results  Component Value Date   TSH 1.76 11/15/2024   Lab Results  Component Value Date   HGBA1C 5.8 (H) 11/15/2024   Lab Results  Component Value Date   CHOL 120 11/15/2024  HDL 37 (L) 11/15/2024   LDLCALC 67 11/15/2024   TRIG 80 11/15/2024   CHOLHDL 3.2 11/15/2024    Significant Diagnostic Results in last 30 days:  No results found.  Assessment/Plan 1. Mixed hyperlipidemia LDL at goal -Continue with dietary modification and  exercise -Continue atorvastatin  - atorvastatin  (LIPITOR) 10 MG tablet; Take 1 tablet (10 mg total) by mouth daily.  Dispense: 90 tablet; Refill: 1 - Lipid panel; Future  2. Prediabetes (Primary) Lab Results  Component Value Date   HGBA1C 5.8 (H) 11/15/2024  - Continue dietary modification and exercise - TSH; Future - CBC with Differential/Platelet; Future - Hemoglobin A1c; Future - CMP; Future  3. Chronic midline low back pain without sciatica - Stable -Continue with current pain regimen  4. Chronic hip pain, bilateral Continue current pain management - Continue exercise for range of motion  Family/ staff Communication: Reviewed plan of care with patient verbalized understanding  Labs/tests ordered:  - CBC with Differential/Platelet - CMP with eGFR([facility]) - TSH - Hgb A1C - Lipid panel  Next Appointment : Return in about 6 months (around 05/19/2025) for medical mangement of chronic issues., Fasting labs in 6 months prior to visit.   Spent 30 minutes of Face to face and non-face to face with patient  >50% time spent counseling; reviewing medical record; tests; labs; documentation and developing future plan of care.   Roxan BROCKS Song Garris, NP        [1]  Allergies Allergen Reactions   Other Shortness Of Breath and Swelling    Pan alba,   Swelling in throat and tounge   "

## 2024-11-30 ENCOUNTER — Other Ambulatory Visit (HOSPITAL_BASED_OUTPATIENT_CLINIC_OR_DEPARTMENT_OTHER): Payer: Self-pay

## 2024-12-13 ENCOUNTER — Other Ambulatory Visit (HOSPITAL_BASED_OUTPATIENT_CLINIC_OR_DEPARTMENT_OTHER): Payer: Self-pay

## 2024-12-20 ENCOUNTER — Other Ambulatory Visit (HOSPITAL_BASED_OUTPATIENT_CLINIC_OR_DEPARTMENT_OTHER): Payer: Self-pay

## 2025-01-01 ENCOUNTER — Other Ambulatory Visit (HOSPITAL_COMMUNITY): Payer: Self-pay

## 2025-05-16 ENCOUNTER — Other Ambulatory Visit

## 2025-05-19 ENCOUNTER — Ambulatory Visit: Admitting: Family
